# Patient Record
Sex: Female | Born: 1965 | Race: Black or African American | Hispanic: No | Marital: Married | Smoking: Never smoker
Health system: Southern US, Community
[De-identification: ages and names within clinical notes are randomized; demographics above are authoritative.]

## PROBLEM LIST (undated history)

## (undated) DIAGNOSIS — M7542 Impingement syndrome of left shoulder: Secondary | ICD-10-CM

## (undated) DIAGNOSIS — G709 Myoneural disorder, unspecified: Secondary | ICD-10-CM

## (undated) DIAGNOSIS — E669 Obesity, unspecified: Secondary | ICD-10-CM

## (undated) HISTORY — PX: WISDOM TOOTH EXTRACTION: SHX21

## (undated) HISTORY — PX: TUBAL LIGATION: SHX77

---

## 1998-06-17 ENCOUNTER — Encounter: Payer: Self-pay | Admitting: Emergency Medicine

## 1998-06-17 ENCOUNTER — Emergency Department (HOSPITAL_COMMUNITY): Admission: EM | Admit: 1998-06-17 | Discharge: 1998-06-17 | Payer: Self-pay | Admitting: Emergency Medicine

## 2004-04-14 ENCOUNTER — Ambulatory Visit: Payer: Self-pay | Admitting: Family Medicine

## 2004-04-22 ENCOUNTER — Ambulatory Visit: Payer: Self-pay | Admitting: Family Medicine

## 2004-05-04 ENCOUNTER — Emergency Department (HOSPITAL_COMMUNITY): Admission: EM | Admit: 2004-05-04 | Discharge: 2004-05-05 | Payer: Self-pay | Admitting: Emergency Medicine

## 2004-05-05 ENCOUNTER — Ambulatory Visit (HOSPITAL_COMMUNITY): Admission: RE | Admit: 2004-05-05 | Discharge: 2004-05-05 | Payer: Self-pay | Admitting: Family Medicine

## 2004-05-12 ENCOUNTER — Ambulatory Visit: Payer: Self-pay | Admitting: Family Medicine

## 2004-05-13 ENCOUNTER — Ambulatory Visit (HOSPITAL_COMMUNITY): Admission: RE | Admit: 2004-05-13 | Discharge: 2004-05-13 | Payer: Self-pay | Admitting: Family Medicine

## 2004-06-17 ENCOUNTER — Ambulatory Visit (HOSPITAL_COMMUNITY): Admission: RE | Admit: 2004-06-17 | Discharge: 2004-06-17 | Payer: Self-pay | Admitting: Family Medicine

## 2004-08-03 ENCOUNTER — Other Ambulatory Visit: Admission: RE | Admit: 2004-08-03 | Discharge: 2004-08-03 | Payer: Self-pay | Admitting: Otolaryngology

## 2004-08-22 HISTORY — PX: TUMOR REMOVAL: SHX12

## 2004-09-15 ENCOUNTER — Ambulatory Visit (HOSPITAL_COMMUNITY): Admission: RE | Admit: 2004-09-15 | Discharge: 2004-09-16 | Payer: Self-pay | Admitting: Otolaryngology

## 2004-09-19 ENCOUNTER — Ambulatory Visit: Payer: Self-pay | Admitting: *Deleted

## 2012-03-28 ENCOUNTER — Emergency Department (HOSPITAL_COMMUNITY): Payer: No Typology Code available for payment source

## 2012-03-28 ENCOUNTER — Emergency Department (HOSPITAL_COMMUNITY)
Admission: EM | Admit: 2012-03-28 | Discharge: 2012-03-29 | Disposition: A | Discharge status: Home / Self Care | Payer: No Typology Code available for payment source | Attending: Emergency Medicine | Admitting: Emergency Medicine

## 2012-03-28 ENCOUNTER — Encounter (HOSPITAL_COMMUNITY): Payer: Self-pay | Admitting: *Deleted

## 2012-03-28 DIAGNOSIS — Y9301 Activity, walking, marching and hiking: Secondary | ICD-10-CM | POA: Insufficient documentation

## 2012-03-28 DIAGNOSIS — S3981XA Other specified injuries of abdomen, initial encounter: Secondary | ICD-10-CM | POA: Insufficient documentation

## 2012-03-28 DIAGNOSIS — S20219A Contusion of unspecified front wall of thorax, initial encounter: Secondary | ICD-10-CM | POA: Insufficient documentation

## 2012-03-28 DIAGNOSIS — IMO0002 Reserved for concepts with insufficient information to code with codable children: Secondary | ICD-10-CM | POA: Insufficient documentation

## 2012-03-28 DIAGNOSIS — S5000XA Contusion of unspecified elbow, initial encounter: Secondary | ICD-10-CM | POA: Insufficient documentation

## 2012-03-28 LAB — CBC WITH DIFFERENTIAL/PLATELET
Basophils Absolute: 0 10*3/uL (ref 0.0–0.1)
Basophils Relative: 1 % (ref 0–1)
Eosinophils Absolute: 0.7 10*3/uL (ref 0.0–0.7)
MCH: 23.7 pg — ABNORMAL LOW (ref 26.0–34.0)
MCHC: 32.1 g/dL (ref 30.0–36.0)
Monocytes Relative: 6 % (ref 3–12)
Neutro Abs: 3.1 10*3/uL (ref 1.7–7.7)
Neutrophils Relative %: 38 % — ABNORMAL LOW (ref 43–77)
Platelets: 347 10*3/uL (ref 150–400)
RDW: 16.6 % — ABNORMAL HIGH (ref 11.5–15.5)

## 2012-03-28 LAB — COMPREHENSIVE METABOLIC PANEL
AST: 16 U/L (ref 0–37)
Albumin: 3.7 g/dL (ref 3.5–5.2)
Alkaline Phosphatase: 81 U/L (ref 39–117)
BUN: 14 mg/dL (ref 6–23)
Potassium: 3.8 mEq/L (ref 3.5–5.1)
Total Protein: 7.7 g/dL (ref 6.0–8.3)

## 2012-03-28 MED ORDER — ONDANSETRON 8 MG PO TBDP
8.0000 mg | ORAL_TABLET | Freq: Once | ORAL | Status: AC
  Administered 2012-03-28: 8 mg via ORAL
  Filled 2012-03-28: qty 1

## 2012-03-28 MED ORDER — IOHEXOL 300 MG/ML  SOLN
100.0000 mL | Freq: Once | INTRAMUSCULAR | Status: AC | PRN
  Administered 2012-03-28: 100 mL via INTRAVENOUS

## 2012-03-28 MED ORDER — HYDROMORPHONE HCL PF 1 MG/ML IJ SOLN
1.0000 mg | Freq: Once | INTRAMUSCULAR | Status: AC
  Administered 2012-03-28: 1 mg via INTRAVENOUS
  Filled 2012-03-28: qty 1

## 2012-03-28 MED ORDER — SODIUM CHLORIDE 0.9 % IV BOLUS (SEPSIS)
500.0000 mL | Freq: Once | INTRAVENOUS | Status: AC
  Administered 2012-03-28: 500 mL via INTRAVENOUS

## 2012-03-28 MED ORDER — OXYCODONE-ACETAMINOPHEN 5-325 MG PO TABS
2.0000 | ORAL_TABLET | Freq: Once | ORAL | Status: AC
  Administered 2012-03-28: 2 via ORAL
  Filled 2012-03-28: qty 2

## 2012-03-28 NOTE — ED Provider Notes (Signed)
Medical screening examination/treatment/procedure(s) were performed by non-physician practitioner and as supervising physician I was immediately available for consultation/collaboration.  Gerhard Munch, MD 03/28/12 2340

## 2012-03-28 NOTE — ED Notes (Signed)
PA at bedside to assess patient.
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
## 2012-03-28 NOTE — ED Notes (Signed)
IV site stop functioning while pt in CT dept
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
## 2012-03-29 MED ORDER — OXYCODONE-ACETAMINOPHEN 5-325 MG PO TABS
1.0000 | ORAL_TABLET | Freq: Once | ORAL | Status: AC
  Administered 2012-03-29: 1 via ORAL
  Filled 2012-03-29: qty 1

## 2012-03-29 MED ORDER — OXYCODONE-ACETAMINOPHEN 5-325 MG PO TABS: 1.0000 | ORAL_TABLET | Freq: Four times a day (QID) | ORAL | Status: DC | PRN
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
## 2012-06-22 HISTORY — PX: WRIST ARTHRODESIS: SUR65

## 2012-11-08 ENCOUNTER — Encounter (HOSPITAL_BASED_OUTPATIENT_CLINIC_OR_DEPARTMENT_OTHER): Payer: Self-pay | Admitting: *Deleted

## 2012-11-08 NOTE — Progress Notes (Signed)
Pt denies any cardiac or resp problems-had surgery this wrist 3/14 [facility]-no problems

## 2012-11-12 NOTE — H&P (Signed)
Emily Higgins is an 47 y.o. female.   Chief Complaint: right wrist pain  HPI: Pt with injury to right wrist Pt having difficulty with turning of wrist and mobility Pt had MRI showing ecu tendon subluxation and distal radioulnar joint ligament tear Pt here for surgery  Past Medical History  Diagnosis Date  . Neuromuscular disorder     numbness rt hand    Past Surgical History  Procedure Laterality Date  . Tubal ligation    . Wrist arthrodesis  3/14    tendon repair rt wrist  . 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 No pertinent family history. Social History:  reports that she has never smoked. She does not have any smokeless tobacco history on file. She reports that  drinks alcohol. She reports that she does not use illicit drugs.  Allergies: No Known Allergies  No prescriptions prior to admission    No results found for this or any previous visit (from the past 48 hour(s)). No results found.  No recent illnesses or hospitalizations  Height 5\' 1"  (1.549 m), weight 112.038 kg (247 lb), last menstrual period 10/30/2012. General Appearance:  Alert, cooperative, no distress, appears stated age  Head:  Normocephalic, without obvious abnormality, atraumatic  Eyes:  Pupils equal, conjunctiva/corneas clear,         Throat: Lips, mucosa, and tongue normal; teeth and gums normal  Neck: No visible masses     Lungs:   respirations unlabored  Chest Wall:  No tenderness or deformity  Heart:  Regular rate and rhythm,  Abdomen:   Soft, non-tender,         Extremities: Right wrist: limited druj mobility. Fingers warm well perfused Good digital motion and limited wrist mobility. No erythema or lymphangitis Good elbow flexion and extension  Pulses: 2+ and symmetric  Skin: Skin color, texture, turgor normal, no rashes or lesions     Neurologic: Normal    Assessment/Plan Right wrist ecu tendon subluxation and distal radioulnar joint instability and  possible tfcc tear  Right wrist ecu tendon stabilization and open distal radioulnar joint stabilization and or repair  R/B/A DISCUSSED WITH PT IN OFFICE.  PT VOICED UNDERSTANDING OF PLAN CONSENT SIGNED DAY OF SURGERY PT SEEN AND EXAMINED PRIOR TO OPERATIVE PROCEDURE/DAY OF SURGERY SITE MARKED. QUESTIONS ANSWERED WILL SEE ABOUT EXTENT OF SURGERY AND DECIDE ABOUT STAYING OR [facility] FOLLOWING SURGERY  Sharma Covert 11/13/2012 @1255  pm

## 2012-11-13 ENCOUNTER — Ambulatory Visit (HOSPITAL_BASED_OUTPATIENT_CLINIC_OR_DEPARTMENT_OTHER)
Admission: RE | Admit: 2012-11-13 | Discharge: 2012-11-13 | Disposition: A | Discharge status: Home / Self Care | Payer: BC Managed Care – PPO | Source: Ambulatory Visit | Attending: Orthopedic Surgery | Admitting: Orthopedic Surgery

## 2012-11-13 ENCOUNTER — Encounter (HOSPITAL_BASED_OUTPATIENT_CLINIC_OR_DEPARTMENT_OTHER): Payer: Self-pay | Admitting: Anesthesiology

## 2012-11-13 ENCOUNTER — Encounter (HOSPITAL_BASED_OUTPATIENT_CLINIC_OR_DEPARTMENT_OTHER)
Admission: RE | Disposition: A | Discharge status: Home / Self Care | Payer: Self-pay | Source: Ambulatory Visit | Attending: Orthopedic Surgery

## 2012-11-13 ENCOUNTER — Ambulatory Visit (HOSPITAL_BASED_OUTPATIENT_CLINIC_OR_DEPARTMENT_OTHER): Payer: BC Managed Care – PPO | Admitting: Anesthesiology

## 2012-11-13 DIAGNOSIS — X58XXXA Exposure to other specified factors, initial encounter: Secondary | ICD-10-CM | POA: Insufficient documentation

## 2012-11-13 DIAGNOSIS — Z6841 Body Mass Index (BMI) 40.0 and over, adult: Secondary | ICD-10-CM | POA: Insufficient documentation

## 2012-11-13 DIAGNOSIS — G709 Myoneural disorder, unspecified: Secondary | ICD-10-CM | POA: Insufficient documentation

## 2012-11-13 DIAGNOSIS — S63599A Other specified sprain of unspecified wrist, initial encounter: Secondary | ICD-10-CM | POA: Insufficient documentation

## 2012-11-13 DIAGNOSIS — S6991XA Unspecified injury of right wrist, hand and finger(s), initial encounter: Secondary | ICD-10-CM

## 2012-11-13 DIAGNOSIS — Y929 Unspecified place or not applicable: Secondary | ICD-10-CM | POA: Insufficient documentation

## 2012-11-13 DIAGNOSIS — D649 Anemia, unspecified: Secondary | ICD-10-CM | POA: Insufficient documentation

## 2012-11-13 DIAGNOSIS — S66819A Strain of other specified muscles, fascia and tendons at wrist and hand level, unspecified hand, initial encounter: Secondary | ICD-10-CM | POA: Insufficient documentation

## 2012-11-13 HISTORY — PX: TENDON REPAIR: SHX5111

## 2012-11-13 HISTORY — DX: Myoneural disorder, unspecified: G70.9

## 2012-11-13 SURGERY — TENDON REPAIR
Anesthesia: General | Site: Wrist | Laterality: Right | Wound class: Clean

## 2012-11-13 MED ORDER — FENTANYL CITRATE 0.05 MG/ML IJ SOLN
100.0000 ug | Freq: Once | INTRAMUSCULAR | Status: AC
  Administered 2012-11-13: 100 ug via INTRAVENOUS

## 2012-11-13 MED ORDER — FENTANYL CITRATE 0.05 MG/ML IJ SOLN
INTRAMUSCULAR | Status: DC | PRN
  Administered 2012-11-13: 50 ug via INTRAVENOUS
  Administered 2012-11-13: 25 ug via INTRAVENOUS
  Administered 2012-11-13 (×2): 50 ug via INTRAVENOUS
  Administered 2012-11-13: 25 ug via INTRAVENOUS

## 2012-11-13 MED ORDER — OXYCODONE-ACETAMINOPHEN 10-325 MG PO TABS: 1.0000 | ORAL_TABLET | ORAL | Status: DC | PRN

## 2012-11-13 MED ORDER — LACTATED RINGERS IV SOLN
INTRAVENOUS | Status: DC
  Administered 2012-11-13 (×2): via INTRAVENOUS

## 2012-11-13 MED ORDER — ROPIVACAINE HCL 5 MG/ML IJ SOLN
INTRAMUSCULAR | Status: DC | PRN
  Administered 2012-11-13: 30 mL

## 2012-11-13 MED ORDER — OXYCODONE HCL 5 MG PO TABS
5.0000 mg | ORAL_TABLET | Freq: Once | ORAL | Status: AC | PRN
  Administered 2012-11-13: 5 mg via ORAL

## 2012-11-13 MED ORDER — VITAMIN C 500 MG PO TABS: 500.0000 mg | ORAL_TABLET | Freq: Two times a day (BID) | ORAL | Status: DC

## 2012-11-13 MED ORDER — PROMETHAZINE HCL 25 MG/ML IJ SOLN: 6.2500 mg | INTRAMUSCULAR | Status: DC | PRN

## 2012-11-13 MED ORDER — DOCUSATE SODIUM 100 MG PO CAPS: 100.0000 mg | ORAL_CAPSULE | Freq: Two times a day (BID) | ORAL | Status: DC

## 2012-11-13 MED ORDER — HYDROMORPHONE HCL PF 1 MG/ML IJ SOLN
0.2500 mg | INTRAMUSCULAR | Status: DC | PRN
  Administered 2012-11-13 (×4): 0.5 mg via INTRAVENOUS

## 2012-11-13 MED ORDER — OXYCODONE HCL 5 MG/5ML PO SOLN: 5.0000 mg | Freq: Once | ORAL | Status: AC | PRN

## 2012-11-13 MED ORDER — DEXAMETHASONE SODIUM PHOSPHATE 4 MG/ML IJ SOLN
INTRAMUSCULAR | Status: DC | PRN
  Administered 2012-11-13: 10 mg via INTRAVENOUS

## 2012-11-13 MED ORDER — SUCCINYLCHOLINE CHLORIDE 20 MG/ML IJ SOLN
INTRAMUSCULAR | Status: DC | PRN
  Administered 2012-11-13: 100 mg via INTRAVENOUS

## 2012-11-13 MED ORDER — CHLORHEXIDINE GLUCONATE 4 % EX LIQD: 60.0000 mL | Freq: Once | CUTANEOUS | Status: DC

## 2012-11-13 MED ORDER — PROPOFOL 10 MG/ML IV BOLUS
INTRAVENOUS | Status: DC | PRN
  Administered 2012-11-13: 50 mg via INTRAVENOUS
  Administered 2012-11-13: 200 mg via INTRAVENOUS

## 2012-11-13 MED ORDER — MIDAZOLAM HCL 2 MG/2ML IJ SOLN
1.0000 mg | INTRAMUSCULAR | Status: DC | PRN
  Administered 2012-11-13: 2 mg via INTRAVENOUS

## 2012-11-13 MED ORDER — METHOCARBAMOL 500 MG PO TABS: 500.0000 mg | ORAL_TABLET | Freq: Four times a day (QID) | ORAL | Status: DC

## 2012-11-13 MED ORDER — DEXTROSE 5 % IV SOLN
3.0000 g | INTRAVENOUS | Status: AC
  Administered 2012-11-13: 3 g via INTRAVENOUS

## 2012-11-13 MED ORDER — LIDOCAINE HCL (CARDIAC) 20 MG/ML IV SOLN
INTRAVENOUS | Status: DC | PRN
  Administered 2012-11-13: 50 mg via INTRAVENOUS

## 2012-11-13 MED ORDER — ONDANSETRON HCL 4 MG/2ML IJ SOLN
INTRAMUSCULAR | Status: DC | PRN
  Administered 2012-11-13: 4 mg via INTRAVENOUS

## 2012-11-13 MED ORDER — CEFAZOLIN SODIUM-DEXTROSE 2-3 GM-% IV SOLR: 2.0000 g | INTRAVENOUS | Status: DC

## 2012-11-13 SURGICAL SUPPLY — 61 items
APL SKNCLS STERI-STRIP NONHPOA (GAUZE/BANDAGES/DRESSINGS) ×1
BANDAGE ELASTIC 3 VELCRO ST LF (GAUZE/BANDAGES/DRESSINGS) ×2 IMPLANT
BANDAGE ELASTIC 4 VELCRO ST LF (GAUZE/BANDAGES/DRESSINGS) ×2 IMPLANT
BANDAGE GAUZE ELAST BULKY 4 IN (GAUZE/BANDAGES/DRESSINGS) ×2 IMPLANT
BENZOIN TINCTURE PRP APPL 2/3 (GAUZE/BANDAGES/DRESSINGS) ×2 IMPLANT
BLADE SURG 15 STRL LF DISP TIS (BLADE) ×1 IMPLANT
BLADE SURG 15 STRL SS (BLADE) ×2
BNDG CMPR 9X4 STRL LF SNTH (GAUZE/BANDAGES/DRESSINGS) ×1
BNDG ESMARK 4X9 LF (GAUZE/BANDAGES/DRESSINGS) ×2 IMPLANT
CORDS BIPOLAR (ELECTRODE) ×2 IMPLANT
COVER MAYO STAND STRL (DRAPES) ×2 IMPLANT
COVER TABLE BACK 60X90 (DRAPES) ×2 IMPLANT
CUFF TOURNIQUET SINGLE 18IN (TOURNIQUET CUFF) IMPLANT
CUFF TOURNIQUET SINGLE 24IN (TOURNIQUET CUFF) ×2 IMPLANT
DRAPE EXTREMITY T 121X128X90 (DRAPE) ×2 IMPLANT
DRAPE OEC MINIVIEW 54X84 (DRAPES) ×1 IMPLANT
DRAPE SURG 17X23 STRL (DRAPES) ×2 IMPLANT
DRSG EMULSION OIL 3X3 NADH (GAUZE/BANDAGES/DRESSINGS) ×2 IMPLANT
GLOVE BIOGEL PI IND STRL 7.0 (GLOVE) ×1 IMPLANT
GLOVE BIOGEL PI IND STRL 8.5 (GLOVE) ×1 IMPLANT
GLOVE BIOGEL PI INDICATOR 7.0 (GLOVE) ×1
GLOVE BIOGEL PI INDICATOR 8.5 (GLOVE) ×1
GLOVE ECLIPSE 6.5 STRL STRAW (GLOVE) ×2 IMPLANT
GLOVE EPREMIER NITRL EXT CFF L (GLOVE) IMPLANT
GLOVE EXAM NITRILE EXT CFF LRG (GLOVE) ×4 IMPLANT
GLOVE SURG ORTHO 8.0 STRL STRW (GLOVE) ×2 IMPLANT
GOWN BRE IMP PREV XXLGXLNG (GOWN DISPOSABLE) ×2 IMPLANT
GOWN PREVENTION PLUS XLARGE (GOWN DISPOSABLE) ×2 IMPLANT
GOWN PREVENTION PLUS XXLARGE (GOWN DISPOSABLE) IMPLANT
LOOP VESSEL MAXI BLUE (MISCELLANEOUS) IMPLANT
LOOP VESSEL MINI RED (MISCELLANEOUS) IMPLANT
NEEDLE HYPO 25X1 1.5 SAFETY (NEEDLE) IMPLANT
NS IRRIG 1000ML POUR BTL (IV SOLUTION) ×2 IMPLANT
PACK BASIN DAY SURGERY FS (CUSTOM PROCEDURE TRAY) ×2 IMPLANT
PAD CAST 4YDX4 CTTN HI CHSV (CAST SUPPLIES) IMPLANT
PADDING CAST ABS 4INX4YD NS (CAST SUPPLIES) ×2
PADDING CAST ABS COTTON 4X4 ST (CAST SUPPLIES) ×2 IMPLANT
PADDING CAST COTTON 4X4 STRL (CAST SUPPLIES)
SLEEVE SCD COMPRESS KNEE MED (MISCELLANEOUS) ×2 IMPLANT
SLING ARM FOAM STRAP XLG (SOFTGOODS) ×2 IMPLANT
SPLINT FIBERGLASS 3X35 (CAST SUPPLIES) IMPLANT
SPLINT FIBERGLASS 4X30 (CAST SUPPLIES) ×2 IMPLANT
SPONGE GAUZE 4X4 12PLY (GAUZE/BANDAGES/DRESSINGS) ×2 IMPLANT
STOCKINETTE 4X48 STRL (DRAPES) ×2 IMPLANT
STRIP CLOSURE SKIN 1/2X4 (GAUZE/BANDAGES/DRESSINGS) ×2 IMPLANT
SUCTION FRAZIER TIP 10 FR DISP (SUCTIONS) IMPLANT
SUT ETHIBOND 3-0 V-5 (SUTURE) IMPLANT
SUT ETHILON 4 0 PS 2 18 (SUTURE) IMPLANT
SUT MERSILENE 4 0 P 3 (SUTURE) ×4 IMPLANT
SUT MNCRL AB 4-0 PS2 18 (SUTURE) IMPLANT
SUT PROLENE 4 0 PS 2 18 (SUTURE) ×2 IMPLANT
SUT VIC AB 2-0 SH 27 (SUTURE) ×2
SUT VIC AB 2-0 SH 27XBRD (SUTURE) ×1 IMPLANT
SUT VIC AB 3-0 FS2 27 (SUTURE) ×4 IMPLANT
SUT VICRYL 4-0 PS2 18IN ABS (SUTURE) ×2 IMPLANT
SYR BULB 3OZ (MISCELLANEOUS) ×2 IMPLANT
SYR CONTROL 10ML LL (SYRINGE) IMPLANT
TOWEL OR NON WOVEN STRL DISP B (DISPOSABLE) ×2 IMPLANT
TRAY DSU PREP LF (CUSTOM PROCEDURE TRAY) ×2 IMPLANT
TUBE CONNECTING 20X1/4 (TUBING) IMPLANT
UNDERPAD 30X30 INCONTINENT (UNDERPADS AND DIAPERS) ×2 IMPLANT

## 2012-11-13 NOTE — Brief Op Note (Signed)
11/13/2012  12:56 PM  PATIENT:  Emily Higgins  47 y.o. female  PRE-OPERATIVE DIAGNOSIS:  RIGHT WRIST ECU TENDON SUBLUXATION AND TFCC TEAR  POST-OPERATIVE DIAGNOSIS:  SAME  PROCEDURE:  Right wrist ECU tendon stabilization and repair with local tissue  SURGEON:  Surgeon(s) and Role:    * Sharma Covert, MD - Primary  PHYSICIAN ASSISTANT: none  ASSISTANTS: none   ANESTHESIA:   general  EBL:   minimal  BLOOD ADMINISTERED:none  DRAINS: none   LOCAL MEDICATIONS USED:  MARCAINE     SPECIMEN:  No Specimen  DISPOSITION OF SPECIMEN:  N/A  COUNTS:  YES  TOURNIQUET:    DICTATION: .161096  PLAN OF CARE: Admit for overnight observation  PATIENT DISPOSITION:  PACU - hemodynamically stable.   Delay start of Pharmacological VTE agent (>24hrs) due to surgical blood loss or risk of bleeding: not applicable

## 2012-11-13 NOTE — Transfer of Care (Signed)
Immediate Anesthesia Transfer of Care Note  Patient: Emily Higgins  Procedure(s) Performed: Procedure(s): RIGHT WRIST ECU(EXTENSOR CARPI ULNARIS) TENDON STABILIZATION  (Right)  Patient Location: PACU  Anesthesia Type:General and GA combined with regional for post-op pain  Level of Consciousness: awake and alert   Airway & Oxygen Therapy: Patient Spontanous Breathing and Patient connected to face mask oxygen  Post-op Assessment: Report given to PACU RN and Post -op Vital signs reviewed and stable  Post vital signs: Reviewed and stable  Complications: No apparent anesthesia complications

## 2012-11-13 NOTE — Anesthesia Preprocedure Evaluation (Signed)
Anesthesia Evaluation  Patient identified by MRN, date of birth, ID band Patient awake    Reviewed: Allergy & Precautions, H&P   History of Anesthesia Complications Negative for: history of anesthetic complications  Airway Mallampati: I  Neck ROM: Full    Dental  (+) Teeth Intact   Pulmonary neg pulmonary ROS,  breath sounds clear to auscultation        Cardiovascular negative cardio ROS  Rhythm:Regular Rate:Normal     Neuro/Psych    GI/Hepatic Neg liver ROS,   Endo/Other  Morbid obesity  Renal/GU      Musculoskeletal   Abdominal (+) + obese,   Peds  Hematology  (+) Blood dyscrasia, anemia ,   Anesthesia Other Findings   Reproductive/Obstetrics                           Anesthesia Physical Anesthesia Plan  ASA: II  Anesthesia Plan: General   Post-op Pain Management:    Induction: Intravenous  Airway Management Planned: Oral ETT  Additional Equipment:   Intra-op Plan:   Post-operative Plan: Extubation in OR  Informed Consent:   Dental advisory given  Plan Discussed with: CRNA and Surgeon  Anesthesia Plan Comments:         Anesthesia Quick Evaluation

## 2012-11-13 NOTE — Progress Notes (Signed)
Assisted Dr. Massagee with right, ultrasound guided, supraclavicular block. Side rails up, monitors on throughout procedure. See vital signs in flow sheet. Tolerated Procedure well. 

## 2012-11-13 NOTE — Anesthesia Postprocedure Evaluation (Signed)
  Anesthesia Post-op Note  Patient: Emily Higgins  Procedure(s) Performed: Procedure(s): RIGHT WRIST ECU(EXTENSOR CARPI ULNARIS) TENDON STABILIZATION  (Right)  Patient Location: PACU  Anesthesia Type:GA combined with regional for post-op pain  Level of Consciousness: awake  Airway and Oxygen Therapy: Patient Spontanous Breathing  Post-op Pain: moderate  Post-op Assessment: Post-op Vital signs reviewed  Post-op Vital Signs: stable  Complications: No apparent anesthesia complications

## 2012-11-13 NOTE — Anesthesia Procedure Notes (Addendum)
Anesthesia Regional Block:  Supraclavicular block  Pre-Anesthetic Checklist: ,, timeout performed, Correct Patient, Correct Site, Correct Laterality, Correct Procedure, Correct Position, site marked, Risks and benefits discussed,  Surgical consent,  Pre-op evaluation,  At surgeon's request and post-op pain management  Laterality: Left and Upper  Prep: chloraprep       Needles:   Needle Type: Echogenic Needle      Needle Gauge: 22 and 22 G  Needle insertion depth: 5 cm   Additional Needles:  Procedures: Doppler guided and ultrasound guided (picture in chart) Supraclavicular block Narrative:  Start time: 11/13/2012 12:40 PM End time: 11/13/2012 12:51 PM Injection made incrementally with aspirations every 5 mL.  Performed by: Personally  Anesthesiologist: TMassagee  Additional Notes: Tolerated welll   Procedure Name: Intubation Date/Time: 11/13/2012 1:09 PM Performed by: Caren Macadam Pre-anesthesia Checklist: Patient identified, Emergency Drugs available, Suction available and Patient being monitored Patient Re-evaluated:Patient Re-evaluated prior to inductionOxygen Delivery Method: Circle System Utilized Preoxygenation: Pre-oxygenation with 100% oxygen Intubation Type: IV induction Ventilation: Mask ventilation without difficulty Laryngoscope Size: Miller and 2 Grade View: Grade I Tube type: Oral Tube size: 7.0 mm Number of attempts: 1 Airway Equipment and Method: stylet and oral airway Placement Confirmation: ETT inserted through vocal cords under direct vision,  positive ETCO2 and breath sounds checked- equal and bilateral Secured at: 21 cm Tube secured with: Tape Dental Injury: Teeth and Oropharynx as per pre-operative assessment

## 2012-11-14 ENCOUNTER — Encounter (HOSPITAL_BASED_OUTPATIENT_CLINIC_OR_DEPARTMENT_OTHER): Payer: Self-pay | Admitting: Orthopedic Surgery

## 2012-11-14 LAB — POCT HEMOGLOBIN-HEMACUE: Hemoglobin: 10.7 g/dL — ABNORMAL LOW (ref 12.0–15.0)
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
## 2013-01-01 ENCOUNTER — Other Ambulatory Visit (HOSPITAL_COMMUNITY): Payer: Self-pay | Admitting: Orthopedic Surgery

## 2013-01-01 DIAGNOSIS — M25531 Pain in right wrist: Secondary | ICD-10-CM

## 2013-01-02 ENCOUNTER — Ambulatory Visit (HOSPITAL_COMMUNITY)
Admission: RE | Admit: 2013-01-02 | Discharge: 2013-01-02 | Disposition: A | Discharge status: Home / Self Care | Payer: BC Managed Care – PPO | Source: Ambulatory Visit | Attending: Orthopedic Surgery | Admitting: Orthopedic Surgery

## 2013-01-02 DIAGNOSIS — X58XXXA Exposure to other specified factors, initial encounter: Secondary | ICD-10-CM | POA: Insufficient documentation

## 2013-01-02 DIAGNOSIS — S63096A Other dislocation of unspecified wrist and hand, initial encounter: Secondary | ICD-10-CM | POA: Insufficient documentation

## 2013-01-02 DIAGNOSIS — M25531 Pain in right wrist: Secondary | ICD-10-CM

## 2013-01-02 DIAGNOSIS — S63599A Other specified sprain of unspecified wrist, initial encounter: Secondary | ICD-10-CM | POA: Insufficient documentation

## 2013-01-02 DIAGNOSIS — M25539 Pain in unspecified wrist: Secondary | ICD-10-CM | POA: Insufficient documentation

## 2013-03-15 ENCOUNTER — Ambulatory Visit (INDEPENDENT_AMBULATORY_CARE_PROVIDER_SITE_OTHER): Payer: BC Managed Care – PPO | Admitting: Emergency Medicine

## 2013-03-15 VITALS — BP 128/86 | HR 100 | Temp 98.2°F | Resp 20 | Ht 63.0 in | Wt 287.6 lb

## 2013-03-15 DIAGNOSIS — N63 Unspecified lump in unspecified breast: Secondary | ICD-10-CM

## 2013-03-15 NOTE — Patient Instructions (Signed)
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
## 2013-03-17 ENCOUNTER — Telehealth: Payer: Self-pay

## 2013-03-17 NOTE — Telephone Encounter (Signed)
She can try [facility], I have called and they can get her in this Wednesday at 12:45. The order is on your computer please sign so I can fax. Thanks.

## 2013-03-17 NOTE — Telephone Encounter (Signed)
Pt states she saw dr Dareen Piano over the weekend and that they discussed her getting a mammogram today , when she called the breast center they told her the first available was dec 8th and she is wanting to talk with someone about this   Best number is [phone]

## 2013-03-18 NOTE — Telephone Encounter (Signed)
Noted, also with the [place] they need to perform a mammo, this is required also. It will be performed at [facility], as patient did not want to wait for available at breast clinic.

## 2013-03-18 NOTE — Telephone Encounter (Signed)
The request for an urgent test was a breast SONO.

## 2013-03-18 NOTE — Telephone Encounter (Signed)
This is to be done tomorrow, faxed signed order to [facility]. Alycia Rossetti has signed for me.

## 2013-03-19 ENCOUNTER — Encounter (HOSPITAL_COMMUNITY): Payer: Self-pay | Admitting: Pharmacy Technician

## 2013-03-19 ENCOUNTER — Telehealth: Payer: Self-pay | Admitting: Radiology

## 2013-03-19 NOTE — Telephone Encounter (Signed)
[facility] seems to have no record of the patients appt today. I have called there to inquire. Patient phone [phone]

## 2013-03-20 ENCOUNTER — Encounter: Payer: Self-pay | Admitting: Emergency Medicine

## 2013-03-20 ENCOUNTER — Ambulatory Visit (INDEPENDENT_AMBULATORY_CARE_PROVIDER_SITE_OTHER): Payer: BC Managed Care – PPO | Admitting: Emergency Medicine

## 2013-03-20 VITALS — BP 132/80 | HR 98 | Temp 98.0°F | Resp 18 | Ht 63.5 in | Wt 293.0 lb

## 2013-03-20 DIAGNOSIS — N611 Abscess of the breast and nipple: Secondary | ICD-10-CM

## 2013-03-20 DIAGNOSIS — N6081 Other benign mammary dysplasias of right breast: Secondary | ICD-10-CM

## 2013-03-20 DIAGNOSIS — N6089 Other benign mammary dysplasias of unspecified breast: Secondary | ICD-10-CM

## 2013-03-20 DIAGNOSIS — N61 Mastitis without abscess: Secondary | ICD-10-CM

## 2013-03-20 MED ORDER — SULFAMETHOXAZOLE-TRIMETHOPRIM 800-160 MG PO TABS: 1.0000 | ORAL_TABLET | Freq: Two times a day (BID) | ORAL | Status: DC

## 2013-03-20 NOTE — Patient Instructions (Signed)
Abscess An abscess is an infected area that contains a collection of pus and debris.It can occur in almost any part of the body. An abscess is also known as a furuncle or boil. CAUSES  An abscess occurs when tissue gets infected. This can occur from blockage of oil or sweat glands, infection of hair follicles, or a minor injury to the skin. As the body tries to fight the infection, pus collects in the area and creates pressure under the skin. This pressure causes pain. People with weakened immune systems have difficulty fighting infections and get certain abscesses more often.  SYMPTOMS Usually an abscess develops on the skin and becomes a painful mass that is red, warm, and tender. If the abscess forms under the skin, you may feel a moveable soft area under the skin. Some abscesses break open (rupture) on their own, but most will continue to get worse without care. The infection can spread deeper into the body and eventually into the bloodstream, causing you to feel ill.  DIAGNOSIS  Your caregiver will take your medical history and perform a physical exam. A sample of fluid may also be taken from the abscess to determine what is causing your infection. TREATMENT  Your caregiver may prescribe antibiotic medicines to fight the infection. However, taking antibiotics alone usually does not cure an abscess. Your caregiver may need to make a small cut (incision) in the abscess to drain the pus. In some cases, gauze is packed into the abscess to reduce pain and to continue draining the area. HOME CARE INSTRUCTIONS   Only take over-the-counter or prescription medicines for pain, discomfort, or fever as directed by your caregiver.  If you were prescribed antibiotics, take them as directed. Finish them even if you start to feel better.  If gauze is used, follow your caregiver's directions for changing the gauze.  To avoid spreading the infection:  Keep your draining abscess covered with a  bandage.  Wash your hands well.  Do not share personal care items, towels, or whirlpools with others.  Avoid skin contact with others.  Keep your skin and clothes clean around the abscess.  Keep all follow-up appointments as directed by your caregiver. SEEK MEDICAL CARE IF:   You have increased pain, swelling, redness, fluid drainage, or bleeding.  You have muscle aches, chills, or a general ill feeling.  You have a fever. MAKE SURE YOU:   Understand these instructions.  Will watch your condition.  Will get help right away if you are not doing well or get worse. Document Released: 01/18/2005 Document Revised: 10/10/2011 Document Reviewed: 06/23/2011 Exit[employer] Patient Information 2014 [employer].  

## 2013-03-20 NOTE — Progress Notes (Signed)
[facility] [address] [place] [address] [phone]  Date:  03/20/2013   Name:  Emily Higgins   DOB:  [birth year]   MRN:  [id number]  PCP:  No PCP Per Patient    Chief Complaint: Follow-up   History of Present Illness:  Emily Higgins is a 47 y.o. very pleasant female patient who presents with the following:  Seen for a breast mass.  Had a negative sono and mamogram.  Now has small drainage from medial region of abscess.  Interval history negative.  No improvement with over the counter medications or other home remedies. Denies other complaint or health concern today.   There are no active problems to display for this patient.   Past Medical History  Diagnosis Date  . Neuromuscular disorder     numbness rt hand    Past Surgical History  Procedure Laterality Date  . Tubal ligation    . Wrist arthrodesis  3/14    tendon repair rt wrist  . 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 Tendon repair Right 11/13/2012    Procedure: RIGHT WRIST ECU(EXTENSOR CARPI ULNARIS) TENDON STABILIZATION ;  Surgeon: Sharma Covert, MD;  Location: [facility];  Service: Orthopedics;  Laterality: Right;    History  Substance Use Topics  . Smoking status: Never Smoker   . Smokeless tobacco: Not on file  . Alcohol Use: Yes     Comment: occ    Family History  Problem Relation Age of Onset  . Diabetes Mother   . Cancer Mother     Breast  . Hypertension Mother     No Known Allergies  Medication list has been reviewed and updated.  Current Outpatient Prescriptions on File Prior to Visit  Medication Sig Dispense Refill  . Cyanocobalamin (VITAMIN B-12 PO) Take 1 tablet by mouth daily.      . Omega-3 Fatty Acids (OMEGA 3 PO) Take 1 capsule by mouth daily.      Marland Kitchen oxyCODONE-acetaminophen (PERCOCET) 10-325 MG per tablet Take 1 tablet by mouth every 4 (four) hours as needed for pain.  40 tablet  0   No current facility-administered  medications on file prior to visit.    Review of Systems:  As per HPI, otherwise negative.    Physical Examination: Filed Vitals:   03/20/13 1303  BP: 132/80  Pulse: 98  Temp: 98 F (36.7 C)  Resp: 18   Filed Vitals:   03/20/13 1303  Height: 5' 3.5" (1.613 m)  Weight: 293 lb (132.904 kg)   Body mass index is 51.08 kg/(m^2). Ideal Body Weight: Weight in (lb) to have BMI = 25: 143.1   GEN: WDWN, NAD, Non-toxic, Alert & Oriented x 3 HEENT: Atraumatic, Normocephalic.  Ears and Nose: No external deformity. EXTR: No clubbing/cyanosis/edema NEURO: Normal gait.  PSYCH: Normally interactive. Conversant. Not depressed or anxious appearing.  Calm demeanor.  RIGHT breast:  Abscess with significant purulent drainage.  No surrounding cellulitis   Assessment and Plan: Abscess breast.  Not lactating [facility] RTC for wound care.   Signed,  Phillips Odor, MD

## 2013-03-20 NOTE — Progress Notes (Signed)
Procedure:  Consent obtained.  Local anesthesia with 2% lido with epi.  #11 blade used to make a 2 cm incision through the area that was draining.  Copious amount of pus expressed along with sebaceous material.  Packed with 1/4in plain packing and drsg applied.  Pt to use warm compresses and recheck in 2 days.

## 2013-03-21 NOTE — Telephone Encounter (Signed)
They did recall the scheduled appt, and have taken care of her.

## 2013-03-22 ENCOUNTER — Ambulatory Visit (INDEPENDENT_AMBULATORY_CARE_PROVIDER_SITE_OTHER): Payer: BC Managed Care – PPO | Admitting: Physician Assistant

## 2013-03-22 VITALS — BP 130/82 | HR 84 | Temp 98.6°F | Resp 18

## 2013-03-22 DIAGNOSIS — N6089 Other benign mammary dysplasias of unspecified breast: Secondary | ICD-10-CM

## 2013-03-22 DIAGNOSIS — N611 Abscess of the breast and nipple: Secondary | ICD-10-CM

## 2013-03-22 DIAGNOSIS — N6081 Other benign mammary dysplasias of right breast: Secondary | ICD-10-CM

## 2013-03-22 DIAGNOSIS — N61 Mastitis without abscess: Secondary | ICD-10-CM

## 2013-03-22 NOTE — Progress Notes (Signed)
   Subjective:    Patient ID: Emily Higgins, female    DOB: Dec 07, 1965, 47 y.o.   MRN: [id number]  HPI  Pt presents to clinic for wound recheck.  She is having local pain and continues to have drainage from the area.  She is trying to keep it covered but it is hard due to the sweating of the area.  She is tolerating the abx ok.  Review of Systems     Objective:   Physical Exam  Vitals reviewed. Constitutional: She is oriented to person, place, and time. She appears well-developed and well-nourished.  Pulmonary/Chest: Effort normal.  Neurological: She is alert and oriented to person, place, and time.  Skin: Skin is warm and dry.  Drsg and packing removed.  Small amount of purulent drainage from the area - significantly reduced erythema and no induration around the incision.  Wound was irrigated and repacked with 1/4 in plain packing.  Drsg placed.  Wound care d/w pt.  Psychiatric: She has a normal mood and affect. Her behavior is normal. Judgment and thought content normal.       Assessment & Plan:  Sebaceous cyst of breast, right  Abscess of breast  Continued abx.  Daily drsg changes.  Recheck in 48h for wound recheck and repacking.  Benny Lennert PA-C 03/22/2013 4:43 PM

## 2013-03-23 LAB — WOUND CULTURE

## 2013-03-25 ENCOUNTER — Ambulatory Visit (INDEPENDENT_AMBULATORY_CARE_PROVIDER_SITE_OTHER): Payer: BC Managed Care – PPO | Admitting: Physician Assistant

## 2013-03-25 VITALS — BP 128/74 | HR 94 | Temp 98.2°F | Resp 16

## 2013-03-25 DIAGNOSIS — N61 Mastitis without abscess: Secondary | ICD-10-CM

## 2013-03-25 DIAGNOSIS — N611 Abscess of the breast and nipple: Secondary | ICD-10-CM

## 2013-03-25 DIAGNOSIS — N6089 Other benign mammary dysplasias of unspecified breast: Secondary | ICD-10-CM

## 2013-03-25 DIAGNOSIS — N6081 Other benign mammary dysplasias of right breast: Secondary | ICD-10-CM

## 2013-03-25 NOTE — Progress Notes (Signed)
   Patient ID: Emily Higgins MRN: [id number], DOB: 10/01/1965 47 y.o. Date of Encounter: 03/25/2013, 1:50 PM  Primary Physician: No PCP Per Patient  Chief Complaint: Wound care   See previous note  HPI: 47 y.o. female presents for wound care s/p I&D on 03/20/13 Doing well No issues or complaints Afebrile/ no chills No nausea or vomiting Tolerating Bactrim DS Pain improving Daily dressing change Previous notes reviewed  She does have a right wrist orthopedic procedure scheduled for 03/29/13 that will require a 1 night hospital stay. See plan for detailed wound care instructions.   Past Medical History  Diagnosis Date  . Neuromuscular disorder     numbness rt hand     Home Meds: Prior to Admission medications   Medication Sig Start Date End Date Taking? Authorizing Provider  Cyanocobalamin (VITAMIN B-12 PO) Take 1 tablet by mouth daily.   Yes Historical Provider, MD  Omega-3 Fatty Acids (OMEGA 3 PO) Take 1 capsule by mouth daily.   Yes Historical Provider, MD  oxyCODONE-acetaminophen (PERCOCET) 10-325 MG per tablet Take 1 tablet by mouth every 4 (four) hours as needed for pain. 11/13/12  Yes Sharma Covert, MD  sulfamethoxazole-trimethoprim (BACTRIM DS,SEPTRA DS) 800-160 MG per tablet Take 1 tablet by mouth 2 (two) times daily. 03/20/13  Yes Phillips Odor, MD    Allergies: No Known Allergies  ROS: Constitutional: Afebrile, no chills Cardiovascular: negative for chest pain or palpitations Dermatological: Positive for wound and improving pain. Negative for erythema or warmth  GI: No nausea or vomiting   EXAM: Physical Exam: Blood pressure 128/74, pulse 94, temperature 98.2 F (36.8 C), temperature source Oral, resp. rate 16, last menstrual period 03/10/2013, SpO2 96.00%., There is no weight on file to calculate BMI. General: Well developed, well nourished, in no acute distress. Nontoxic appearing. Head: Normocephalic, atraumatic, sclera non-icteric.  Neck:  Supple. Lungs: Breathing is unlabored. Heart: Normal rate. Skin:  Warm and moist. Dressing and packing in place. Mild local induration, erythema, and tenderness to palpation around the incision site. Neuro: Alert and oriented X 3. Moves all extremities spontaneously. Normal gait.  Psych:  Responds to questions appropriately with a normal affect.   PROCEDURE: Dressing and packing removed Scant purulent drainage on the dressing Scant purulence initially expressed Wound bed healthy Irrigated with 1% plain lidocaine 5 cc Repacked with small amount of 1/4 inch plain packing  Dressing applied  Amy Cecil in room for the entire procedure   LAB: Culture: GBS  A/P: 47 y.o. female with cellulitis/abscess of the medial right breast as above s/p I&D on 03/20/13 -Wound care per above -Continue Bactrim DS -Pain well controlled and improving -Daily dressing changes -Patient will be admitted for planned orthopedic procedure on 03/29/13 with a 1 night hospital stay. She will follow up with [place] for wound care upon discharge on 03/30/13. She will ask the nursing staff while she is admitted if they are able to perform her wound care, if this is able to happen, she will then follow up with [place] 48 hours after that occurs. She understands these instructions.   Signed, Eula Listen, PA-C [facility] [place], [place] [address] [phone] 03/25/2013 1:50 PM

## 2013-03-26 ENCOUNTER — Encounter (HOSPITAL_COMMUNITY): Payer: Self-pay

## 2013-03-26 ENCOUNTER — Encounter (HOSPITAL_COMMUNITY)
Admission: RE | Admit: 2013-03-26 | Discharge: 2013-03-26 | Disposition: A | Discharge status: Home / Self Care | Payer: BC Managed Care – PPO | Source: Ambulatory Visit | Attending: Orthopedic Surgery | Admitting: Orthopedic Surgery

## 2013-03-26 LAB — CBC
Hemoglobin: 10.8 g/dL — ABNORMAL LOW (ref 12.0–15.0)
MCHC: 31.4 g/dL (ref 30.0–36.0)
WBC: 6.1 10*3/uL (ref 4.0–10.5)

## 2013-03-26 LAB — HCG, SERUM, QUALITATIVE: Preg, Serum: NEGATIVE

## 2013-03-26 NOTE — H&P (Signed)
Emily Higgins is an 47 y.o. female.   Chief Complaint: Right wrist pain HPI: PT WELL KNOWN TO ME IN OFFICE PT WITH PERSISTENT RIGHT WRIST PAIN AND INABILITY TO ROTATE HER FOREARM PT HERE FOR SURGERY TO STABILIZE HER WRIST PT SEEN/EXAMINED AND FOLLOWED IN OFFICE  Past Medical History  Diagnosis Date  . Neuromuscular disorder     numbness rt hand    Past Surgical History  Procedure Laterality Date  . Tubal ligation    . Wrist arthrodesis  3/14    tendon repair rt wrist  . 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 Tendon repair Right 11/13/2012    Procedure: RIGHT WRIST ECU(EXTENSOR CARPI ULNARIS) TENDON STABILIZATION ;  Surgeon: Sharma Covert, MD;  Location: [facility];  Service: Orthopedics;  Laterality: Right;    Family History  Problem Relation Age of Onset  . Diabetes Mother   . Cancer Mother     Breast  . Hypertension Mother    Social History:  reports that she has never smoked. She does not have any smokeless tobacco history on file. She reports that she drinks alcohol. She reports that she does not use illicit drugs.  Allergies: No Known Allergies  No prescriptions prior to admission    Results for orders placed during the hospital encounter of 03/26/13 (from the past 48 hour(s))  CBC     Status: Abnormal   Collection Time    03/26/13  3:01 PM      Result Value Range   WBC 6.1  4.0 - 10.5 K/uL   RBC 4.56  3.87 - 5.11 MIL/uL   Hemoglobin 10.8 (*) 12.0 - 15.0 g/dL   HCT 16.1 (*) 09.6 - 04.5 %   MCV 75.4 (*) 78.0 - 100.0 fL   MCH 23.7 (*) 26.0 - 34.0 pg   MCHC 31.4  30.0 - 36.0 g/dL   RDW 40.9 (*) 81.1 - 91.4 %   Platelets 288  150 - 400 K/uL  HCG, SERUM, QUALITATIVE     Status: None   Collection Time    03/26/13  3:01 PM      Result Value Range   Preg, Serum NEGATIVE  NEGATIVE   Comment:            THE SENSITIVITY OF THIS     METHODOLOGY IS >10 mIU/mL.   No results found.  ROS PT ON ANTIBIOTICS FOR LOW GRADE INFECTION IN BREAST  REGION NOTES REVIEWED  Last menstrual period 03/10/2013. Physical Exam  General Appearance:  Alert, cooperative, no distress, appears stated age  Head:  Normocephalic, without obvious abnormality, atraumatic  Eyes:  Pupils equal, conjunctiva/corneas clear,         Throat: Lips, mucosa, and tongue normal; teeth and gums normal  Neck: No visible masses     Lungs:   respirations unlabored  Chest Wall:  No tenderness or deformity  Heart:  Regular rate and rhythm,  Abdomen:   Soft, non-tender,         Extremities: RIGHT WRIST WELL HEALED INCISION DORSALLY, UNABLE TO PRONATE AND SUPINATE FOREARM FINGERS WARM WELL PERFUSED LIMITED DIGITAL MOTION GOOD RADIAL PULSE GOOD ELBOW FLEXION AND EXTENSION  Pulses: 2+ and symmetric  Skin: Skin color, texture, turgor normal, no rashes or lesions     Neurologic: Normal    Assessment/Plan RIGHT WRIST DISTAL RADIOULNAR JOINT INSTABILITY AND DRUJ INCONGRUITY  RIGHT WRIST DISTAL RADIOULNAR JOINT EXPLORATION AND REPAIR VERSUS TENDON GRAFTING, ADAMS PROCEDURE FOR  STABILIZATION OF DISTAL RADIOULNAR JOINT  R/B/A DISCUSSED WITH PT IN OFFICE.  PT VOICED UNDERSTANDING OF PLAN CONSENT SIGNED DAY OF SURGERY PT SEEN AND EXAMINED PRIOR TO OPERATIVE PROCEDURE/DAY OF SURGERY SITE MARKED. QUESTIONS ANSWERED WILL REMAIN OVERNIGHT OBSERVATION FOLLOWING SURGERY  Sharma Covert 03/26/2013, 6:47 PM

## 2013-03-27 ENCOUNTER — Ambulatory Visit (HOSPITAL_COMMUNITY)
Admission: RE | Admit: 2013-03-27 | Discharge: 2013-03-29 | Disposition: A | Discharge status: Home / Self Care | Payer: BC Managed Care – PPO | Source: Ambulatory Visit | Attending: Orthopedic Surgery | Admitting: Orthopedic Surgery

## 2013-03-27 ENCOUNTER — Encounter (HOSPITAL_COMMUNITY): Payer: BC Managed Care – PPO | Admitting: Anesthesiology

## 2013-03-27 ENCOUNTER — Encounter (HOSPITAL_COMMUNITY)
Admission: RE | Disposition: A | Discharge status: Home / Self Care | Payer: Self-pay | Source: Ambulatory Visit | Attending: Orthopedic Surgery

## 2013-03-27 ENCOUNTER — Encounter (HOSPITAL_COMMUNITY): Payer: Self-pay | Admitting: *Deleted

## 2013-03-27 ENCOUNTER — Ambulatory Visit (HOSPITAL_COMMUNITY): Payer: BC Managed Care – PPO | Admitting: Anesthesiology

## 2013-03-27 DIAGNOSIS — M25531 Pain in right wrist: Secondary | ICD-10-CM | POA: Diagnosis present

## 2013-03-27 DIAGNOSIS — Z01812 Encounter for preprocedural laboratory examination: Secondary | ICD-10-CM | POA: Insufficient documentation

## 2013-03-27 DIAGNOSIS — IMO0002 Reserved for concepts with insufficient information to code with codable children: Secondary | ICD-10-CM | POA: Insufficient documentation

## 2013-03-27 DIAGNOSIS — M24439 Recurrent dislocation, unspecified wrist: Secondary | ICD-10-CM | POA: Insufficient documentation

## 2013-03-27 HISTORY — PX: WRIST FUSION: SHX839

## 2013-03-27 SURGERY — FUSION, JOINT, WRIST
Anesthesia: General | Site: Wrist | Laterality: Right

## 2013-03-27 MED ORDER — CEFAZOLIN SODIUM 1-5 GM-% IV SOLN
1.0000 g | INTRAVENOUS | Status: AC
  Administered 2013-03-27: 1 g via INTRAVENOUS
  Filled 2013-03-27: qty 50

## 2013-03-27 MED ORDER — HYDROMORPHONE HCL 2 MG PO TABS: 2.0000 mg | ORAL_TABLET | ORAL | Status: DC | PRN

## 2013-03-27 MED ORDER — VECURONIUM BROMIDE 10 MG IV SOLR
INTRAVENOUS | Status: DC | PRN
  Administered 2013-03-27 (×3): 1 mg via INTRAVENOUS
  Administered 2013-03-27: 2 mg via INTRAVENOUS

## 2013-03-27 MED ORDER — MINERAL OIL LIGHT 100 % EX OIL
TOPICAL_OIL | CUTANEOUS | Status: DC | PRN
  Administered 2013-03-27: 1 via TOPICAL

## 2013-03-27 MED ORDER — HYDROMORPHONE HCL PF 1 MG/ML IJ SOLN
0.5000 mg | INTRAMUSCULAR | Status: DC | PRN
  Administered 2013-03-27 – 2013-03-28 (×3): 1 mg via INTRAVENOUS
  Administered 2013-03-28: 0.5 mg via INTRAVENOUS
  Administered 2013-03-28 (×2): 1 mg via INTRAVENOUS
  Filled 2013-03-27 (×6): qty 1

## 2013-03-27 MED ORDER — HYDROMORPHONE HCL PF 1 MG/ML IJ SOLN
0.2500 mg | INTRAMUSCULAR | Status: DC | PRN
  Administered 2013-03-27 (×5): 0.5 mg via INTRAVENOUS

## 2013-03-27 MED ORDER — LIDOCAINE HCL (CARDIAC) 20 MG/ML IV SOLN
INTRAVENOUS | Status: DC | PRN
  Administered 2013-03-27: 100 mg via INTRAVENOUS

## 2013-03-27 MED ORDER — ONDANSETRON HCL 4 MG PO TABS: 4.0000 mg | ORAL_TABLET | Freq: Four times a day (QID) | ORAL | Status: DC | PRN

## 2013-03-27 MED ORDER — ONDANSETRON HCL 4 MG/2ML IJ SOLN: 4.0000 mg | Freq: Four times a day (QID) | INTRAMUSCULAR | Status: DC | PRN

## 2013-03-27 MED ORDER — SUCCINYLCHOLINE CHLORIDE 20 MG/ML IJ SOLN
INTRAMUSCULAR | Status: DC | PRN
  Administered 2013-03-27: 100 mg via INTRAVENOUS

## 2013-03-27 MED ORDER — OXYCODONE-ACETAMINOPHEN 10-325 MG PO TABS: 1.0000 | ORAL_TABLET | ORAL | Status: DC | PRN

## 2013-03-27 MED ORDER — VITAMIN C 500 MG PO TABS
1000.0000 mg | ORAL_TABLET | Freq: Every day | ORAL | Status: DC
  Administered 2013-03-27 – 2013-03-29 (×3): 1000 mg via ORAL
  Filled 2013-03-27 (×3): qty 2

## 2013-03-27 MED ORDER — SULFAMETHOXAZOLE-TMP DS 800-160 MG PO TABS
1.0000 | ORAL_TABLET | Freq: Two times a day (BID) | ORAL | Status: DC
  Administered 2013-03-27 – 2013-03-29 (×4): 1 via ORAL
  Filled 2013-03-27 (×5): qty 1

## 2013-03-27 MED ORDER — ONDANSETRON HCL 4 MG/2ML IJ SOLN
INTRAMUSCULAR | Status: DC | PRN
  Administered 2013-03-27: 4 mg via INTRAVENOUS

## 2013-03-27 MED ORDER — DOCUSATE SODIUM 100 MG PO CAPS
100.0000 mg | ORAL_CAPSULE | Freq: Two times a day (BID) | ORAL | Status: DC
  Administered 2013-03-27 – 2013-03-29 (×4): 100 mg via ORAL
  Filled 2013-03-27 (×4): qty 1

## 2013-03-27 MED ORDER — HYDROMORPHONE HCL PF 1 MG/ML IJ SOLN
INTRAMUSCULAR | Status: AC
  Filled 2013-03-27: qty 1

## 2013-03-27 MED ORDER — HYDROMORPHONE HCL PF 1 MG/ML IJ SOLN
0.5000 mg | INTRAMUSCULAR | Status: DC | PRN
  Administered 2013-03-27 (×2): 0.5 mg via INTRAVENOUS

## 2013-03-27 MED ORDER — SULFAMETHOXAZOLE-TRIMETHOPRIM 800-160 MG PO TABS: 1.0000 | ORAL_TABLET | Freq: Two times a day (BID) | ORAL | Status: DC

## 2013-03-27 MED ORDER — ROCURONIUM BROMIDE 100 MG/10ML IV SOLN
INTRAVENOUS | Status: DC | PRN
  Administered 2013-03-27: 40 mg via INTRAVENOUS
  Administered 2013-03-27: 10 mg via INTRAVENOUS

## 2013-03-27 MED ORDER — 0.9 % SODIUM CHLORIDE (POUR BTL) OPTIME
TOPICAL | Status: DC | PRN
  Administered 2013-03-27: 1000 mL

## 2013-03-27 MED ORDER — CEFAZOLIN SODIUM 1-5 GM-% IV SOLN
1.0000 g | Freq: Three times a day (TID) | INTRAVENOUS | Status: DC
  Administered 2013-03-27 – 2013-03-28 (×3): 1 g via INTRAVENOUS
  Filled 2013-03-27 (×4): qty 50

## 2013-03-27 MED ORDER — PHENYLEPHRINE HCL 10 MG/ML IJ SOLN
INTRAMUSCULAR | Status: DC | PRN
  Administered 2013-03-27: 80 ug via INTRAVENOUS

## 2013-03-27 MED ORDER — MIDAZOLAM HCL 5 MG/5ML IJ SOLN
INTRAMUSCULAR | Status: DC | PRN
  Administered 2013-03-27: 2 mg via INTRAVENOUS

## 2013-03-27 MED ORDER — DIPHENHYDRAMINE HCL 25 MG PO CAPS: 25.0000 mg | ORAL_CAPSULE | Freq: Four times a day (QID) | ORAL | Status: DC | PRN

## 2013-03-27 MED ORDER — FENTANYL CITRATE 0.05 MG/ML IJ SOLN
INTRAMUSCULAR | Status: DC | PRN
  Administered 2013-03-27 (×4): 50 ug via INTRAVENOUS
  Administered 2013-03-27 (×2): 25 ug via INTRAVENOUS
  Administered 2013-03-27: 50 ug via INTRAVENOUS
  Administered 2013-03-27: 25 ug via INTRAVENOUS
  Administered 2013-03-27: 100 ug via INTRAVENOUS
  Administered 2013-03-27 (×2): 25 ug via INTRAVENOUS

## 2013-03-27 MED ORDER — METHOCARBAMOL 500 MG PO TABS
500.0000 mg | ORAL_TABLET | Freq: Four times a day (QID) | ORAL | Status: DC | PRN
  Administered 2013-03-27 – 2013-03-29 (×6): 500 mg via ORAL
  Filled 2013-03-27 (×7): qty 1

## 2013-03-27 MED ORDER — PROPOFOL 10 MG/ML IV BOLUS
INTRAVENOUS | Status: DC | PRN
  Administered 2013-03-27: 340 mg via INTRAVENOUS

## 2013-03-27 MED ORDER — GLYCOPYRROLATE 0.2 MG/ML IJ SOLN
INTRAMUSCULAR | Status: DC | PRN
  Administered 2013-03-27: 0.4 mg via INTRAVENOUS

## 2013-03-27 MED ORDER — HYDROCODONE-ACETAMINOPHEN 5-325 MG PO TABS
ORAL_TABLET | ORAL | Status: AC
  Administered 2013-03-27: 2
  Filled 2013-03-27: qty 2

## 2013-03-27 MED ORDER — DOCUSATE SODIUM 100 MG PO CAPS: 100.0000 mg | ORAL_CAPSULE | Freq: Two times a day (BID) | ORAL | Status: DC

## 2013-03-27 MED ORDER — CEFAZOLIN SODIUM 1-5 GM-% IV SOLN
1.0000 g | Freq: Once | INTRAVENOUS | Status: DC
  Filled 2013-03-27: qty 50

## 2013-03-27 MED ORDER — LACTATED RINGERS IV SOLN
INTRAVENOUS | Status: DC | PRN
  Administered 2013-03-27 (×3): via INTRAVENOUS

## 2013-03-27 MED ORDER — DEXTROSE 5 % IV SOLN
500.0000 mg | Freq: Four times a day (QID) | INTRAVENOUS | Status: DC | PRN
  Filled 2013-03-27 (×2): qty 5

## 2013-03-27 MED ORDER — ADULT MULTIVITAMIN W/MINERALS CH
1.0000 | ORAL_TABLET | Freq: Every day | ORAL | Status: DC
  Administered 2013-03-27 – 2013-03-29 (×3): 1 via ORAL
  Filled 2013-03-27 (×3): qty 1

## 2013-03-27 MED ORDER — HYDROCODONE-ACETAMINOPHEN 7.5-325 MG PO TABS
1.0000 | ORAL_TABLET | ORAL | Status: DC | PRN
  Administered 2013-03-28 (×2): 2 via ORAL
  Filled 2013-03-27 (×2): qty 2

## 2013-03-27 MED ORDER — OXYCODONE HCL 5 MG/5ML PO SOLN: 5.0000 mg | Freq: Once | ORAL | Status: DC | PRN

## 2013-03-27 MED ORDER — NEOSTIGMINE METHYLSULFATE 1 MG/ML IJ SOLN
INTRAMUSCULAR | Status: DC | PRN
  Administered 2013-03-27: 3 mg via INTRAVENOUS

## 2013-03-27 MED ORDER — MINERAL OIL LIGHT 100 % EX OIL
TOPICAL_OIL | CUTANEOUS | Status: AC
  Filled 2013-03-27: qty 25

## 2013-03-27 MED ORDER — OXYCODONE HCL 5 MG PO TABS: 5.0000 mg | ORAL_TABLET | Freq: Once | ORAL | Status: DC | PRN

## 2013-03-27 MED ORDER — CEFAZOLIN SODIUM-DEXTROSE 2-3 GM-% IV SOLR
INTRAVENOUS | Status: AC
  Administered 2013-03-27: 3 g via INTRAVENOUS
  Filled 2013-03-27: qty 50

## 2013-03-27 MED ORDER — EPHEDRINE SULFATE 50 MG/ML IJ SOLN
INTRAMUSCULAR | Status: DC | PRN
  Administered 2013-03-27: 5 mg via INTRAVENOUS

## 2013-03-27 MED ORDER — HYDROMORPHONE HCL PF 1 MG/ML IJ SOLN
INTRAMUSCULAR | Status: AC
  Administered 2013-03-27: 0.5 mg via INTRAVENOUS
  Filled 2013-03-27: qty 1

## 2013-03-27 MED ORDER — KCL IN DEXTROSE-NACL 20-5-0.45 MEQ/L-%-% IV SOLN
INTRAVENOUS | Status: DC
  Administered 2013-03-27 – 2013-03-28 (×2): via INTRAVENOUS
  Filled 2013-03-27 (×3): qty 1000

## 2013-03-27 MED ORDER — MIDAZOLAM HCL 2 MG/2ML IJ SOLN: 0.5000 mg | Freq: Once | INTRAMUSCULAR | Status: DC | PRN

## 2013-03-27 MED ORDER — OXYCODONE-ACETAMINOPHEN 5-325 MG PO TABS
1.0000 | ORAL_TABLET | ORAL | Status: DC | PRN
  Administered 2013-03-27 – 2013-03-29 (×5): 1 via ORAL
  Filled 2013-03-27 (×5): qty 1

## 2013-03-27 MED ORDER — PROMETHAZINE HCL 25 MG/ML IJ SOLN: 6.2500 mg | INTRAMUSCULAR | Status: DC | PRN

## 2013-03-27 MED ORDER — ALPRAZOLAM 0.5 MG PO TABS: 0.5000 mg | ORAL_TABLET | Freq: Four times a day (QID) | ORAL | Status: DC | PRN

## 2013-03-27 MED ORDER — KCL IN DEXTROSE-NACL 20-5-0.45 MEQ/L-%-% IV SOLN
INTRAVENOUS | Status: AC
  Filled 2013-03-27: qty 1000

## 2013-03-27 MED ORDER — ZOLPIDEM TARTRATE 5 MG PO TABS: 5.0000 mg | ORAL_TABLET | Freq: Every evening | ORAL | Status: DC | PRN

## 2013-03-27 MED ORDER — LACTATED RINGERS IV SOLN
INTRAVENOUS | Status: DC
  Administered 2013-03-27: 13:00:00 via INTRAVENOUS

## 2013-03-27 MED ORDER — METHOCARBAMOL 500 MG PO TABS: 500.0000 mg | ORAL_TABLET | Freq: Four times a day (QID) | ORAL | Status: DC | PRN

## 2013-03-27 MED ORDER — CHLORHEXIDINE GLUCONATE 4 % EX LIQD: 60.0000 mL | Freq: Once | CUTANEOUS | Status: DC

## 2013-03-27 MED ORDER — MEPERIDINE HCL 25 MG/ML IJ SOLN: 6.2500 mg | INTRAMUSCULAR | Status: DC | PRN

## 2013-03-27 MED ORDER — OXYCODONE HCL 5 MG PO TABS
5.0000 mg | ORAL_TABLET | ORAL | Status: DC | PRN
  Administered 2013-03-28 (×4): 10 mg via ORAL
  Administered 2013-03-28 (×2): 5 mg via ORAL
  Administered 2013-03-28 – 2013-03-29 (×5): 10 mg via ORAL
  Filled 2013-03-27: qty 2
  Filled 2013-03-27: qty 1
  Filled 2013-03-27 (×4): qty 2
  Filled 2013-03-27: qty 1
  Filled 2013-03-27: qty 2
  Filled 2013-03-27: qty 1
  Filled 2013-03-27 (×3): qty 2

## 2013-03-27 MED ORDER — CEFAZOLIN SODIUM-DEXTROSE 2-3 GM-% IV SOLR: 2.0000 g | INTRAVENOUS | Status: DC

## 2013-03-27 SURGICAL SUPPLY — 72 items
BANDAGE ELASTIC 3 VELCRO ST LF (GAUZE/BANDAGES/DRESSINGS) ×1 IMPLANT
BANDAGE ELASTIC 4 VELCRO ST LF (GAUZE/BANDAGES/DRESSINGS) ×9 IMPLANT
BANDAGE GAUZE ELAST BULKY 4 IN (GAUZE/BANDAGES/DRESSINGS) ×3 IMPLANT
BLADE LONG MED 31X9 (MISCELLANEOUS) ×3 IMPLANT
BLADE SURG 15 STRL LF DISP TIS (BLADE) ×2 IMPLANT
BLADE SURG 15 STRL SS (BLADE) ×3
BLADE SURG ROTATE 9660 (MISCELLANEOUS) IMPLANT
BNDG CMPR 9X4 STRL LF SNTH (GAUZE/BANDAGES/DRESSINGS) ×2
BNDG COHESIVE 3X5 TAN STRL LF (GAUZE/BANDAGES/DRESSINGS) ×3 IMPLANT
BNDG ESMARK 4X9 LF (GAUZE/BANDAGES/DRESSINGS) ×3 IMPLANT
BRUSH SCRUB EZ PLAIN DRY (MISCELLANEOUS) ×1 IMPLANT
BUR GATOR 2.9 (BURR) IMPLANT
CANISTER SUCTION 2500CC (MISCELLANEOUS) ×3 IMPLANT
CLOTH BEACON ORANGE TIMEOUT ST (SAFETY) IMPLANT
CORDS BIPOLAR (ELECTRODE) ×3 IMPLANT
COVER SURGICAL LIGHT HANDLE (MISCELLANEOUS) ×3 IMPLANT
CUFF TOURNIQUET SINGLE 18IN (TOURNIQUET CUFF) ×1 IMPLANT
CUFF TOURNIQUET SINGLE 24IN (TOURNIQUET CUFF) ×3 IMPLANT
DRAIN TLS ROUND 10FR (DRAIN) IMPLANT
DRAPE OEC MINIVIEW 54X84 (DRAPES) ×6 IMPLANT
DRAPE SURG 17X11 SM STRL (DRAPES) ×3 IMPLANT
DRSG ADAPTIC 3X8 NADH LF (GAUZE/BANDAGES/DRESSINGS) ×3 IMPLANT
ELECT REM PT RETURN 9FT ADLT (ELECTROSURGICAL)
ELECTRODE REM PT RTRN 9FT ADLT (ELECTROSURGICAL) IMPLANT
FIBERLOOP 2 0 (SUTURE) ×6 IMPLANT
GAUZE SPONGE 4X4 16PLY XRAY LF (GAUZE/BANDAGES/DRESSINGS) ×3 IMPLANT
GAUZE XEROFORM 5X9 LF (GAUZE/BANDAGES/DRESSINGS) ×3 IMPLANT
GLOVE BIOGEL PI IND STRL 8.5 (GLOVE) ×2 IMPLANT
GLOVE BIOGEL PI INDICATOR 8.5 (GLOVE) ×1
GLOVE SURG ORTHO 8.0 STRL STRW (GLOVE) ×5 IMPLANT
GOWN PREVENTION PLUS XLARGE (GOWN DISPOSABLE) ×3 IMPLANT
GOWN STRL NON-REIN LRG LVL3 (GOWN DISPOSABLE) ×9 IMPLANT
KIT BASIN OR (CUSTOM PROCEDURE TRAY) ×3 IMPLANT
KIT ROOM TURNOVER OR (KITS) ×3 IMPLANT
KWIRE 4.0 X .062IN (WIRE) ×4 IMPLANT
MANIFOLD NEPTUNE II (INSTRUMENTS) IMPLANT
NDL HYPO 21X1.5 SAFETY (NEEDLE) ×1 IMPLANT
NDL SUT 6 .5 CRC .975X.05 MAYO (NEEDLE) ×2 IMPLANT
NEEDLE HYPO 21X1.5 SAFETY (NEEDLE) ×3 IMPLANT
NEEDLE HYPO 25GX1X1/2 BEV (NEEDLE) ×3 IMPLANT
NEEDLE HYPO 25X1 1.5 SAFETY (NEEDLE) ×3 IMPLANT
NEEDLE MAYO TAPER (NEEDLE) ×3
NS IRRIG 1000ML POUR BTL (IV SOLUTION) ×3 IMPLANT
PACK ORTHO EXTREMITY (CUSTOM PROCEDURE TRAY) ×3 IMPLANT
PAD ARMBOARD 7.5X6 YLW CONV (MISCELLANEOUS) ×6 IMPLANT
PAD CAST 4YDX4 CTTN HI CHSV (CAST SUPPLIES) ×6 IMPLANT
PADDING CAST COTTON 4X4 STRL (CAST SUPPLIES) ×9
PASSER SUT SWANSON 36MM LOOP (INSTRUMENTS) ×6 IMPLANT
SET SM JOINT TUBING/CANN (CANNULA) IMPLANT
SOAP 2 % CHG 4 OZ (WOUND CARE) ×3 IMPLANT
SPLINT FIBERGLASS 4X30 (CAST SUPPLIES) ×3 IMPLANT
SPONGE GAUZE 4X4 12PLY (GAUZE/BANDAGES/DRESSINGS) ×3 IMPLANT
STRIP CLOSURE SKIN 1/2X4 (GAUZE/BANDAGES/DRESSINGS) IMPLANT
SUT ETHILON 4 0 PS 2 18 (SUTURE) ×3 IMPLANT
SUT FIBER WIRE 4.0 (SUTURE) ×3 IMPLANT
SUT FIBERWIRE 2-0 18 17.9 3/8 (SUTURE) ×9
SUT MNCRL AB 4-0 PS2 18 (SUTURE) IMPLANT
SUT PROLENE 4 0 P 3 18 (SUTURE) ×3 IMPLANT
SUT PROLENE 4 0 PS 2 18 (SUTURE) ×3 IMPLANT
SUT VIC AB 2-0 FS1 27 (SUTURE) ×3 IMPLANT
SUT VICRYL 4-0 PS2 18IN ABS (SUTURE) ×5 IMPLANT
SUTURE FIBERWR 2-0 18 17.9 3/8 (SUTURE) ×6 IMPLANT
SYR CONTROL 10ML LL (SYRINGE) IMPLANT
SYSTEM CHEST DRAIN TLS 7FR (DRAIN) IMPLANT
TOWEL OR 17X24 6PK STRL BLUE (TOWEL DISPOSABLE) ×3 IMPLANT
TOWEL OR 17X26 10 PK STRL BLUE (TOWEL DISPOSABLE) ×3 IMPLANT
TRAP DIGIT (INSTRUMENTS) IMPLANT
TUBE CONNECTING 12X1/4 (SUCTIONS) ×3 IMPLANT
TUBING CYSTO DISP (UROLOGICAL SUPPLIES) IMPLANT
UNDERPAD 30X30 INCONTINENT (UNDERPADS AND DIAPERS) ×3 IMPLANT
WATER STERILE IRR 1000ML POUR (IV SOLUTION) IMPLANT
YANKAUER SUCT BULB TIP NO VENT (SUCTIONS) ×3 IMPLANT

## 2013-03-27 NOTE — Preoperative (Signed)
Beta Blockers   Reason not to administer Beta Blockers:Not Applicable 

## 2013-03-27 NOTE — Anesthesia Procedure Notes (Signed)
Procedure Name: Intubation Date/Time: 03/27/2013 1:55 PM Performed by: Lovie Chol Pre-anesthesia Checklist: Patient identified, Emergency Drugs available, Suction available, Patient being monitored and Timeout performed Patient Re-evaluated:Patient Re-evaluated prior to inductionOxygen Delivery Method: Circle system utilized Preoxygenation: Pre-oxygenation with 100% oxygen Intubation Type: IV induction Ventilation: Mask ventilation without difficulty Laryngoscope Size: Miller and 2 Grade View: Grade I Tube type: Oral Tube size: 7.0 mm Number of attempts: 1 Airway Equipment and Method: Stylet Placement Confirmation: ETT inserted through vocal cords under direct vision,  positive ETCO2,  CO2 detector and breath sounds checked- equal and bilateral Secured at: 21 cm Tube secured with: Tape Dental Injury: Teeth and Oropharynx as per pre-operative assessment

## 2013-03-27 NOTE — Brief Op Note (Signed)
03/27/2013  1:49 PM  PATIENT:  Emily Higgins  47 y.o. female  PRE-OPERATIVE DIAGNOSIS:  RIGHT WRIST DISTAL RADIOULNAR JOINT SUBLUXATION  POST-OPERATIVE DIAGNOSIS:  SAME  PROCEDURE:  Procedure(s): RIGHT WRIST DISTAL RADIOULNAR JOINT RECONSTRUCTION WITH POSSIBLE PINNNING AND TENDON GRAFTING (Right) ARTHROSCOPY WRIST (Right)  SURGEON:  Surgeon(s) and Role:    * Sharma Covert, MD - Primary  PHYSICIAN ASSISTANT:   ASSISTANTS:GRAMIG  ANESTHESIA:   general  EBL:     BLOOD ADMINISTERED:none  DRAINS: none   LOCAL MEDICATIONS USED:  MARCAINE     SPECIMEN:  No Specimen  DISPOSITION OF SPECIMEN:  N/A  COUNTS:  YES  TOURNIQUET:    [id number] JoB ID dictation  PLAN OF CARE: Admit for overnight observation  PATIENT DISPOSITION:  PACU - hemodynamically stable.   Delay start of Pharmacological VTE agent (>24hrs) due to surgical blood loss or risk of bleeding: not applicable

## 2013-03-27 NOTE — Anesthesia Postprocedure Evaluation (Signed)
  Anesthesia Post-op Note  Patient: Emily Higgins  Procedure(s) Performed: Procedure(s): RIGHT WRIST DISTAL RADIOULNAR JOINT RECONSTRUCTION WITH POSSIBLE PINNNING AND TENDON GRAFTING (Right)  Patient Location: PACU  Anesthesia Type:General  Level of Consciousness: awake  Airway and Oxygen Therapy: Patient Spontanous Breathing  Post-op Pain: mild  Post-op Assessment: Post-op Vital signs reviewed  Post-op Vital Signs: stable  Complications: No apparent anesthesia complications

## 2013-03-27 NOTE — Transfer of Care (Signed)
Immediate Anesthesia Transfer of Care Note  Patient: Emily Higgins  Procedure(s) Performed: Procedure(s): RIGHT WRIST DISTAL RADIOULNAR JOINT RECONSTRUCTION WITH POSSIBLE PINNNING AND TENDON GRAFTING (Right)  Patient Location: PACU  Anesthesia Type:General  Level of Consciousness: awake and alert   Airway & Oxygen Therapy: Patient Spontanous Breathing and Patient connected to nasal cannula oxygen  Post-op Assessment: Report given to PACU RN, Post -op Vital signs reviewed and stable and Patient moving all extremities X 4  Post vital signs: Reviewed and stable  Complications: No apparent anesthesia complications

## 2013-03-27 NOTE — Anesthesia Preprocedure Evaluation (Addendum)
Anesthesia Evaluation    Airway       Dental  (+) Dental Advisory Given   Pulmonary          Cardiovascular     Neuro/Psych    GI/Hepatic   Endo/Other  Morbid obesity  Renal/GU      Musculoskeletal   Abdominal   Peds  Hematology   Anesthesia Other Findings   Reproductive/Obstetrics                          Anesthesia Physical Anesthesia Plan  ASA: II  Anesthesia Plan: General   Post-op Pain Management:    Induction: Intravenous  Airway Management Planned: Oral ETT  Additional Equipment:   Intra-op Plan:   Post-operative Plan: Extubation in OR  Informed Consent: I have reviewed the patients History and Physical, chart, labs and discussed the procedure including the risks, benefits and alternatives for the proposed anesthesia with the patient or authorized representative who has indicated his/her understanding and acceptance.   Dental advisory given  Plan Discussed with: CRNA, Anesthesiologist and Surgeon  Anesthesia Plan Comments:         Anesthesia Quick Evaluation

## 2013-03-28 MED ORDER — OXYCODONE HCL ER 20 MG PO T12A
20.0000 mg | EXTENDED_RELEASE_TABLET | Freq: Two times a day (BID) | ORAL | Status: DC
  Administered 2013-03-28 – 2013-03-29 (×2): 20 mg via ORAL
  Filled 2013-03-28 (×2): qty 1

## 2013-03-28 NOTE — Progress Notes (Deleted)
03/28/13 1300  OT G-codes **NOT FOR INPATIENT CLASS**  Functional Assessment Tool Used Clincal observation  Functional Limitation Self care  Self Care Current Status (U9811) CM  Self Care Goal Status (B1478) CM  Self Care Discharge Status [phone]) CM  Ignacia Palma, OTR/L [phone] 03/28/2013

## 2013-03-28 NOTE — Progress Notes (Signed)
03/28/13 1300  OT G-codes **NOT FOR INPATIENT CLASS**  Functional Assessment Tool Used Clincal observation  Functional Limitation Self care  Self Care Current Status [phone]) CM  Self Care Goal Status [phone]) Margarita Grizzle, OTR/L [phone] 03/28/2013

## 2013-03-28 NOTE — Op Note (Signed)
Emily Higgins, Emily Higgins               ACCOUNT NO.:  [id number]  MEDICAL RECORD NO.:  [id number]  LOCATION:  [id number]                        FACILITY:  [facility]  PHYSICIAN:  Madelynn Done, MD  DATE OF BIRTH:  1966-01-25  DATE OF PROCEDURE:  03/27/2013 DATE OF DISCHARGE:                              OPERATIVE REPORT   PREOPERATIVE DIAGNOSIS:  Right wrist distal radioulnar joint instability, chronic.  POSTOPERATIVE DIAGNOSIS:  Right wrist distal radioulnar joint instability, chronic.  ATTENDING PHYSICIAN:  Sharma Covert IV, MD, who scrubbed and present for the entire procedure.  ASSISTANT SURGEON:  Dionne Ano. Gramig, M.D., who was scrubbed and present for the entire procedure and help to assist with passage of the tendon and complex reconstruction of the distal radioulnar joint.  SURGICAL PROCEDURE: 1. Right wrist distal radioulnar joint reconstruction with a tendon     graft. 2. Right wrist, application of uniplanar external fixation. 3. Right wrist, open TFCC repair. 4. Right wrist radiographs 2 views.  SURGICAL INDICATIONS:  Emily Higgins is a right-hand dominant female who was followed in the office with persistent instability of the distal radioulnar joint.  Given the failure and the instability in the dorsal position of the distal ulna, it was recommended she undergo the above procedure.  Risks, benefits, and alternatives were discussed in detail with the patient and signed informed consent was obtained.  Risks included, but not limited to bleeding, infection, damage to nearby nerves, arteries or tendons, loss motion of wrist and digits, incomplete relief of symptoms, and need for further surgical intervention.  DESCRIPTION OF PROCEDURE:  The patient was properly identified in the preop holding area and marked with a permanent marker made on the right wrist to indicate correct operative site.  The patient was then brought back to the operating room, placed supine on the  anesthesia room table. General anesthesia was administered.  The patient tolerated this well. A well-padded tourniquet was placed on the right brachium and sealed with 1000 drape.  Right upper extremity was then prepped and draped in normal sterile fashion.  Time-out was called, correct side was identified, and procedure was then begun.  A 6 cm incision made between the fifth and sixth dorsal compartment.  The limb was then elevated and tourniquet insufflated.  Dissection was then carried down through the skin and subcutaneous tissue.  Going through the fifth dorsal compartment, the fifth dorsal compartment was then carefully entered protecting the sixth dorsal compartment.  The fourth dorsal compartment was then elevated off the dorsal aspect of the radius.  A L-shaped dorsal capsulotomy was then carried out.  The fourth dorsal compartment was then elevated.  The patient did have good TFCC tissue.  Several 2-0 FiberWire sutures were then placed in the TFCC for later repair. Following this, after it was felt that the patient did not have adequate tissue to undergo TFCC open repair, therefore the tendon reconstruction was undertaken.  The longitudinal incision made between the neurovascular bundles ulnarly and the flexor tendons radially.  The palmaris longus was then carefully identified and harvested with a tendon stripper.  The palmaris longus was then repaired using a 2-0 FiberLoop suture.  Once the tendon  was then harvested and prepared, the fourth dorsal compartment was then elevated and the dorsal margin in the sigmoid notch was then confirmed using the mini C-arm.  Using a 3.5 mm cannulated drill bit, 3.5 mm drill bit was then drilled through the volar aspect carefully protecting the neurovascular bundle exiting through the palmar incision.  Once this was carried out, the 2nd tunnel was then created along the ulnar neck through the fovea into the neck. This was confirmed using a  mini C-arm, using a similar technique.  The suture retriever was then passed through the radius tunnel, dorsal to palmar and the tendon was then retrieved.  Through the dorsal incision, a hemostat passed over the ulnar head but proximal to the TFCC passed through the palmar distal radioulnar joint and was able to grab the other end of the graft.  Both graft limbs were then passed through the ulnar tunnel.  Then, both limbs were then passed around the ulnar neck under the ECU pulling tightly.  Once this was carried out, the joint was then reduced given the size in the soft tissue.  Application of an uniplanar external fixation was then carried out placing two 0.0625 K- wires from the ulna into the radius with good purchase.  The K-wires were then cut and bent and left.  Both limbs of the suture with the joint having been reduced.  The both wounds were then placed around the ulnar neck and tied with sutures.  The wound was then irrigated.  The capsule was then closed with 2-0 FiberWire and 2-0 Vicryl suture.  The retinaculum was then closed with 2-0 Vicryl suture.  Copious wound irrigation done throughout.  Subcutaneous tissue and both incisions were then closed with 4-0 Vicryl, and the skin was closed with simple Prolene sutures.  Final radiographs were then obtained.  The patient was then placed in a long-arm splint, extubated, and taken to recovery room in good condition.  Intraoperative radiographs 2 views of the wrist did show the K-wire fixation in place in good position with the reduction of the distal radioulnar joint.  POSTPROCEDURE PLAN:  The patient will be admitted overnight for IV antibiotics, pain control, long-arm splint for a total of 6 weeks, pins out at the 6-week mark; 8 weeks, long-arm cast immobilization; short-arm cast for a total of 2 weeks; and then begin a therapy regimen around the 10-week mark very cautiously and slow with her given the soft tissue incised.   Radiographs at each visit.     Madelynn Done, MD     FWO/MEDQ  D:  03/27/2013  T:  03/28/2013  Job:  [id number]

## 2013-03-28 NOTE — Evaluation (Addendum)
Occupational Therapy Evaluation Patient Details Name: Emily Higgins MRN: [id number] DOB: January 15, 1966 Today's Date: 03/28/2013 Time: 4098-1191 OT Time Calculation (min): 37 min  OT Assessment / Plan / Recommendation History of present illness Right wrist distal radioulnar joint reconstruction with a tendon graft. Right wrist, application of uniplanar external fixation. Right wrist, open TFCC repair.   Clinical Impression   This 47 yo female admitted and underwent above presents to acute OT with decreased pain control, decreased ability to tolerate movement of digits; increased edema in hand--all affecting pt's ability to take care of herself. This is the 3rd surgery that pt has had on this arm and she also has rotator cuff issues on right side as well. Will benefit from acute OT with follow up OPOT.    OT Assessment  Patient needs continued OT Services    Follow Up Recommendations  Outpatient OT       Equipment Recommendations  None recommended by OT       Frequency  Min 3X/week    Precautions / Restrictions Precautions Precautions: None Restrictions Weight Bearing Restrictions: Yes RUE Weight Bearing: Non weight bearing   Pertinent Vitals/Pain 10/10 RUE with attempt at movement; RN made aware, repositioned, ice applied    ADL  Eating/Feeding: Set up Where Assessed - Eating/Feeding: Chair Grooming: Set up Where Assessed - Grooming: Unsupported sitting Upper Body Bathing: +1 Total assistance Where Assessed - Upper Body Bathing: Unsupported sitting Lower Body Bathing: +1 Total assistance Where Assessed - Lower Body Bathing: Unsupported sit to stand Upper Body Dressing: +1 Total assistance Where Assessed - Upper Body Dressing: Unsupported sitting Lower Body Dressing: +1 Total assistance Where Assessed - Lower Body Dressing: Unsupported sit to stand Toilet Transfer: Min Pension scheme manager Method: Sit to Barista:  (Bed to recliner 5 feet  away) Toileting - Clothing Manipulation and Hygiene: +1 Total assistance Where Assessed - Toileting Clothing Manipulation and Hygiene: Sit to stand from 3-in-1 or toilet Equipment Used:  (None) Transfers/Ambulation Related to ADLs: Min guard A sit<>stand and ambulation without AE    OT Diagnosis: Generalized weakness;Acute pain  OT Problem List: Decreased strength;Decreased range of motion;Decreased activity tolerance;Pain;Impaired UE functional use;Obesity OT Treatment Interventions: Self-care/ADL training;Therapeutic exercise;Therapeutic activities;DME and/or AE instruction;Patient/family education   OT Goals(Current goals can be found in the care plan section) Acute Rehab OT Goals Patient Stated Goal: To get pain under control OT Goal Formulation: With patient Time For Goal Achievement: 04/04/13 Potential to Achieve Goals: Good  Visit Information  Last OT Received On: 03/28/13 Assistance Needed: +1 History of Present Illness: Right wrist distal radioulnar joint reconstruction with a tendon graft. Right wrist, application of uniplanar external fixation. Right wrist, open TFCC repair.       Prior Functioning     Home Living Family/patient expects to be discharged to:: Private residence Living Arrangements: Spouse/significant other;Children Available Help at Discharge: Family;Available 24 hours/day Home Layout: One level Home Equipment: None Prior Function Level of Independence: Independent Communication Communication: No difficulties Dominant Hand: Right         Vision/Perception Vision - History Patient Visual Report: No change from baseline   Cognition  Cognition Arousal/Alertness: Awake/alert Behavior During Therapy: WFL for tasks assessed/performed Overall Cognitive Status: Within Functional Limits for tasks assessed    Extremity/Trunk Assessment Upper Extremity Assessment Upper Extremity Assessment: RUE deficits/detail RUE Deficits / Details: decreased use  throughout even in joints not affected by casting/splinting RUE Sensation:  (hypersensitive pain) RUE Coordination: decreased fine motor;decreased gross motor  Mobility Bed Mobility Bed Mobility: Supine to Sit;Sitting - Scoot to Edge of Bed Supine to Sit: 6: Modified independent (Device/Increase time);With rails;HOB elevated Sitting - Scoot to Edge of Bed: 6: Modified independent (Device/Increase time);With rail Transfers Transfers: Sit to Stand;Stand to Sit Sit to Stand: 4: Min guard;With upper extremity assist;From bed (LUE) Stand to Sit: 4: Min assist;With upper extremity assist;To chair/3-in-1 (LUE)     Exercise Other Exercises Other Exercises: Attempted RUE digit exercises; however pt with c/o severe pain with any attempt at PROM or AROM; tried washcloth roll in hand to try and work on finger passive extension, however pt could not tolerate it. Ica pack applied to see if this would help. Pt did perform AAROM with me with her right shoulder for flexion/extension and abduction/adduction--tolerated 10 reps of each      End of Session OT - End of Session Activity Tolerance: Patient limited by pain Patient left: in chair;with call bell/phone within reach;with family/visitor present Nurse Communication: Mobility status;Patient requests pain meds       Evette Georges [phone] 03/28/2013, 12:58 PM

## 2013-03-29 MED ORDER — DOCUSATE SODIUM 100 MG PO CAPS: 100.0000 mg | ORAL_CAPSULE | Freq: Two times a day (BID) | ORAL | Status: DC

## 2013-03-29 MED ORDER — METHOCARBAMOL 500 MG PO TABS: 500.0000 mg | ORAL_TABLET | Freq: Four times a day (QID) | ORAL | Status: DC

## 2013-03-29 MED ORDER — VITAMIN C 500 MG PO TABS: 500.0000 mg | ORAL_TABLET | Freq: Every day | ORAL | Status: DC

## 2013-03-29 NOTE — Discharge Summary (Signed)
Physician Discharge Summary  Patient ID: Emily Higgins MRN: [id number] DOB/AGE: Mar 22, 1966 47 y.o.  Admit date: 03/27/2013 Discharge date: 03/29/2013  Admission Diagnoses: RIGHT WRIST DISTAL RADIOULNAR JOINT SUBLUXATION Past Medical History  Diagnosis Date  . Neuromuscular disorder     numbness rt hand    Discharge Diagnoses:  Active Problems:   Right wrist pain   Surgeries: Procedure(s): RIGHT WRIST DISTAL RADIOULNAR JOINT RECONSTRUCTION WITH POSSIBLE PINNNING AND TENDON GRAFTING on 03/27/2013    Consultants:    Discharged Condition: Improved  Hospital Course: Emily Higgins is an 47 y.o. female who was admitted 03/27/2013 with a chief complaint of No chief complaint on file. , and found to have a diagnosis of RIGHT WRIST DISTAL RADIOULNAR JOINT SUBLUXATION.  They were brought to the operating room on 03/27/2013 and underwent Procedure(s): RIGHT WRIST DISTAL RADIOULNAR JOINT RECONSTRUCTION WITH POSSIBLE PINNNING AND TENDON GRAFTING.    They were given perioperative antibiotics: Anti-infectives   Start     Dose/Rate Route Frequency Ordered Stop   03/28/13 0600  ceFAZolin (ANCEF) IVPB 2 g/50 mL premix  Status:  Discontinued     2 g 100 mL/hr over 30 Minutes Intravenous On call to O.R. 03/27/13 1230 03/27/13 2000   03/28/13 0600  ceFAZolin (ANCEF) IVPB 1 g/50 mL premix  Status:  Discontinued     1 g 100 mL/hr over 30 Minutes Intravenous Every 8 hours 03/27/13 1752 03/28/13 2138   03/27/13 2200  sulfamethoxazole-trimethoprim (BACTRIM DS) 800-160 MG per tablet 1 tablet     1 tablet Oral Every 12 hours 03/27/13 2008     03/27/13 1800  ceFAZolin (ANCEF) IVPB 1 g/50 mL premix     1 g 100 mL/hr over 30 Minutes Intravenous NOW 03/27/13 1752 03/27/13 2127   03/27/13 1415  ceFAZolin (ANCEF) IVPB 1 g/50 mL premix  Status:  Discontinued     1 g 100 mL/hr over 30 Minutes Intravenous  Once 03/27/13 1408 03/27/13 2000   03/27/13 1400  sulfamethoxazole-trimethoprim (BACTRIM DS,SEPTRA DS)  800-160 MG per tablet 1 tablet  Status:  Discontinued     1 tablet Oral 2 times daily 03/27/13 1353 03/27/13 2005   03/27/13 1231  ceFAZolin (ANCEF) 2-3 GM-% IVPB SOLR    Comments:  Moore, Terri   : cabinet override      03/27/13 1231 03/27/13 1355    .  They were given sequential compression devices, early ambulation, and Other (comment)ambulation for DVT prophylaxis.  Recent vital signs: Patient Vitals for the past 24 hrs:  BP Temp Pulse Resp SpO2  03/29/13 0521 151/88 mmHg 98.9 F (37.2 C) 81 16 92 %  03/28/13 2047 122/56 mmHg 98.6 F (37 C) 75 16 94 %  03/28/13 1400 122/58 mmHg 98.5 F (36.9 C) 69 16 98 %  .  Recent laboratory studies: No results found.  Discharge Medications:     Medication List         docusate sodium 100 MG capsule  Commonly known as:  COLACE  Take 1 capsule (100 mg total) by mouth 2 (two) times daily.     HYDROmorphone 2 MG tablet  Commonly known as:  DILAUDID  Take 1 tablet (2 mg total) by mouth every 4 (four) hours as needed for moderate pain or severe pain (do not take with percocet).     methocarbamol 500 MG tablet  Commonly known as:  ROBAXIN  Take 1 tablet (500 mg total) by mouth every 6 (six) hours as needed for muscle spasms.  OMEGA 3 PO  Take 1 capsule by mouth daily.     oxyCODONE-acetaminophen 10-325 MG per tablet  Commonly known as:  PERCOCET  Take 1 tablet by mouth every 4 (four) hours as needed for pain.     oxyCODONE-acetaminophen 10-325 MG per tablet  Commonly known as:  PERCOCET  Take 1 tablet by mouth every 4 (four) hours as needed for pain.     sulfamethoxazole-trimethoprim 800-160 MG per tablet  Commonly known as:  BACTRIM DS,SEPTRA DS  Take 1 tablet by mouth 2 (two) times daily.     VITAMIN B-12 PO  Take 1 tablet by mouth daily.        Diagnostic Studies: No results found.  They benefited maximally from their hospital stay and there were no complications.     Disposition: 01-Home or Self Care       Follow-up Information   Follow up with Sharma Covert, MD. Schedule an appointment as soon as possible for a visit in 18 days.   Specialty:  Orthopedic Surgery   Contact information:   [address] [place] [address] [phone]      PT SEEN/EXAMINED ON DAY OF DISCHARGE FELT READY TO GO HOME PAIN CONTROLLED BETTER PT VOICED UNDERSTANDING OF PLAN  Signed: Sharma Covert 03/29/2013, 12:15 PM

## 2013-03-29 NOTE — Progress Notes (Signed)
Patient continued to have pain levels of 8-10 during night requiring OxyIR 10mg  q3-4 hours, Robaxin q6, and received a Percocet for breakthrough pain.  Patient was started on Oxycontin 20mg  last PM.  Elevation maintained. Attempted ice but patient does not believe ice helping d/t bulkiness of splint dressing.  Patient also has continued numbness which is not new.  Good cap refill, fingers warm.  Ortho tech asked to assess splint for tightness.  Ortho tech repositioned arm to provide optimal elevation and positioning and educated patient.  Patient also educated/reinforced active and passive ROM of fingers to aid in reducing edema and increase movement of fingers.  Pain level now at 4 after last dose of Oxy at 0545.  Continue to monitor.

## 2013-03-29 NOTE — Progress Notes (Signed)
Discharge instructions and prescriptions given and explained to patient; no questions or concerns. Patient discharged via wheelchair accompanied by RN and family.

## 2013-03-29 NOTE — Progress Notes (Signed)
Occupational Therapy Treatment Patient Details Name: Emily Higgins MRN: [id number] DOB: January 09, 1966 Today's Date: 03/29/2013 Time: 4098-1191 OT Time Calculation (min): 30 min  OT Assessment / Plan / Recommendation  History of present illness Right wrist distal radioulnar joint reconstruction with a tendon graft. Right wrist, application of uniplanar external fixation. Right wrist, open TFCC repair.   OT comments  Pt tolerated exercise program with improved pain tolerance today of 4/10. Pt reporting numbness in R digits starting at MCPs to fingertips and nursing aware and see note in chart regarding numbness. Pt performed exercises to her tolerance and also discussed self care strategies with UB dressing.   Follow Up Recommendations  Outpatient OT    Barriers to Discharge       Equipment Recommendations  None recommended by OT    Recommendations for Other Services    Frequency Min 3X/week   Progress towards OT Goals Progress towards OT goals: Progressing toward goals  Plan      Precautions / Restrictions Precautions Precautions: None Restrictions Weight Bearing Restrictions: Yes RUE Weight Bearing: Non weight bearing   Pertinent Vitals/Pain 4/10 R hand; reposition, rest    ADL  ADL Comments: Discussed dressing techniques with pt  as she declined needing to practice currently. She was able to verbalize that you don shirt over affected extremity first and has shirts that are loose, easy to manipulate. She states husband has been helping to doff pullover over her head PTA also and can continue to help. Discussed sequence for doffing shirt. Pt states she has a higher toilet at home and a vanity on the left that she can use. Pt stating much more comfortable to have  UE elevated when up to bathroom earlier so informed nursing about asking surgeon about obtaining a sling. Pt states she just returned from bed after going to the bathroom and declined any needs to practice with OT right  now for that.     OT Diagnosis:    OT Problem List:   OT Treatment Interventions:     OT Goals(current goals can now be found in the care plan section)    Visit Information  Last OT Received On: 03/29/13 Assistance Needed: +1 History of Present Illness: Right wrist distal radioulnar joint reconstruction with a tendon graft. Right wrist, application of uniplanar external fixation. Right wrist, open TFCC repair.    Subjective Data      Prior Functioning       Cognition  Cognition Arousal/Alertness: Awake/alert Behavior During Therapy: WFL for tasks assessed/performed Overall Cognitive Status: Within Functional Limits for tasks assessed    Mobility       Exercises  Other Exercises Other Exercises: Pt tolerating exercises much better today both active and active assistive. She performed X 5 shoulder flexion and abduction R UE. X10 active digit extension and flexion to tolerance with better pain control than at eval. Pt then educated on SROM of using L hand to help provide additional extension/flexion to tolerance and pt performed X 10.    Balance     End of Session OT - End of Session Activity Tolerance: Patient tolerated treatment well (tolerated with improved pain control.) Patient left: in bed;with call bell/phone within reach  GO     Lennox Laity [phone] 03/29/2013, 11:47 AM

## 2013-03-29 NOTE — Progress Notes (Signed)
Orthopedic Tech Progress Note Patient Details:  Emily Higgins 1965/05/15 [id number] Arm sling applied.  Ortho Devices Type of Ortho Device: Arm sling Ortho Device/Splint Location: Right UE Ortho Device/Splint Interventions: Application   Asia R Thompson 03/29/2013, 1:49 PM

## 2013-03-31 ENCOUNTER — Other Ambulatory Visit: Payer: BC Managed Care – PPO

## 2013-04-04 ENCOUNTER — Encounter (HOSPITAL_COMMUNITY): Payer: Self-pay | Admitting: Orthopedic Surgery

## 2014-05-01 ENCOUNTER — Emergency Department (HOSPITAL_COMMUNITY)
Admission: EM | Admit: 2014-05-01 | Discharge: 2014-05-02 | Disposition: A | Discharge status: Home / Self Care | Payer: BLUE CROSS/BLUE SHIELD | Attending: Emergency Medicine | Admitting: Emergency Medicine

## 2014-05-01 ENCOUNTER — Emergency Department (HOSPITAL_COMMUNITY): Payer: BLUE CROSS/BLUE SHIELD

## 2014-05-01 ENCOUNTER — Encounter (HOSPITAL_COMMUNITY): Payer: Self-pay | Admitting: Emergency Medicine

## 2014-05-01 DIAGNOSIS — Y998 Other external cause status: Secondary | ICD-10-CM | POA: Diagnosis not present

## 2014-05-01 DIAGNOSIS — E669 Obesity, unspecified: Secondary | ICD-10-CM | POA: Diagnosis not present

## 2014-05-01 DIAGNOSIS — Z79899 Other long term (current) drug therapy: Secondary | ICD-10-CM | POA: Diagnosis not present

## 2014-05-01 DIAGNOSIS — Z9889 Other specified postprocedural states: Secondary | ICD-10-CM | POA: Diagnosis not present

## 2014-05-01 DIAGNOSIS — Z8669 Personal history of other diseases of the nervous system and sense organs: Secondary | ICD-10-CM | POA: Diagnosis not present

## 2014-05-01 DIAGNOSIS — X58XXXA Exposure to other specified factors, initial encounter: Secondary | ICD-10-CM | POA: Diagnosis not present

## 2014-05-01 DIAGNOSIS — Y9389 Activity, other specified: Secondary | ICD-10-CM | POA: Insufficient documentation

## 2014-05-01 DIAGNOSIS — R52 Pain, unspecified: Secondary | ICD-10-CM

## 2014-05-01 DIAGNOSIS — S6991XA Unspecified injury of right wrist, hand and finger(s), initial encounter: Secondary | ICD-10-CM | POA: Insufficient documentation

## 2014-05-01 DIAGNOSIS — Y9289 Other specified places as the place of occurrence of the external cause: Secondary | ICD-10-CM | POA: Diagnosis not present

## 2014-05-01 DIAGNOSIS — M25531 Pain in right wrist: Secondary | ICD-10-CM

## 2014-05-01 HISTORY — DX: Obesity, unspecified: E66.9

## 2014-05-01 MED ORDER — OXYCODONE HCL 5 MG PO TABS
10.0000 mg | ORAL_TABLET | Freq: Once | ORAL | Status: AC
Start: 2014-05-02 — End: 2014-05-02
  Administered 2014-05-02: 10 mg via ORAL
  Filled 2014-05-01: qty 2

## 2014-05-01 MED ORDER — HYDROMORPHONE HCL 1 MG/ML IJ SOLN
1.0000 mg | Freq: Once | INTRAMUSCULAR | Status: AC
Start: 2014-05-01 — End: 2014-05-01
  Administered 2014-05-01: 1 mg via INTRAMUSCULAR
  Filled 2014-05-01: qty 1

## 2014-05-01 NOTE — ED Notes (Signed)
Pt. reports right wrist pain onset this evening , denies recent injury or strenuous activity , pt. stated history of wrist surgery .

## 2014-05-01 NOTE — ED Notes (Signed)
Orthopedic tech paged and replied to page. Will come apply splint.
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
## 2014-05-01 NOTE — Progress Notes (Signed)
Orthopedic Tech Progress Note Patient Details:  Emily Higgins 17-Dec-1965 [id number]  Ortho Devices Type of Ortho Device: Arm sling, [place]ong splint Ortho Device/Splint Interventions: Application   [name], [name] 05/01/2014, 11:55 PM

## 2014-05-01 NOTE — Discharge Instructions (Signed)

## 2016-06-01 ENCOUNTER — Encounter (HOSPITAL_COMMUNITY): Payer: Self-pay

## 2016-06-01 ENCOUNTER — Emergency Department (HOSPITAL_COMMUNITY)
Admission: EM | Admit: 2016-06-01 | Discharge: 2016-06-01 | Disposition: A | Discharge status: Home / Self Care | Payer: Medicare Other | Attending: Emergency Medicine | Admitting: Emergency Medicine

## 2016-06-01 ENCOUNTER — Emergency Department (HOSPITAL_COMMUNITY): Payer: Medicare Other

## 2016-06-01 DIAGNOSIS — Y999 Unspecified external cause status: Secondary | ICD-10-CM | POA: Diagnosis not present

## 2016-06-01 DIAGNOSIS — S46912A Strain of unspecified muscle, fascia and tendon at shoulder and upper arm level, left arm, initial encounter: Secondary | ICD-10-CM | POA: Insufficient documentation

## 2016-06-01 DIAGNOSIS — Y929 Unspecified place or not applicable: Secondary | ICD-10-CM | POA: Insufficient documentation

## 2016-06-01 DIAGNOSIS — Z5181 Encounter for therapeutic drug level monitoring: Secondary | ICD-10-CM | POA: Insufficient documentation

## 2016-06-01 DIAGNOSIS — X58XXXA Exposure to other specified factors, initial encounter: Secondary | ICD-10-CM | POA: Diagnosis not present

## 2016-06-01 DIAGNOSIS — Y939 Activity, unspecified: Secondary | ICD-10-CM | POA: Diagnosis not present

## 2016-06-01 DIAGNOSIS — Z79899 Other long term (current) drug therapy: Secondary | ICD-10-CM | POA: Insufficient documentation

## 2016-06-01 DIAGNOSIS — R930 Abnormal findings on diagnostic imaging of skull and head, not elsewhere classified: Secondary | ICD-10-CM | POA: Diagnosis not present

## 2016-06-01 DIAGNOSIS — S4992XA Unspecified injury of left shoulder and upper arm, initial encounter: Secondary | ICD-10-CM | POA: Diagnosis present

## 2016-06-01 LAB — COMPREHENSIVE METABOLIC PANEL
ALT: 12 U/L — ABNORMAL LOW (ref 14–54)
AST: 17 U/L (ref 15–41)
Albumin: 3.6 g/dL (ref 3.5–5.0)
Alkaline Phosphatase: 68 U/L (ref 38–126)
Anion gap: 11 (ref 5–15)
BUN: 7 mg/dL (ref 6–20)
CO2: 23 mmol/L (ref 22–32)
Calcium: 8.8 mg/dL — ABNORMAL LOW (ref 8.9–10.3)
Chloride: 102 mmol/L (ref 101–111)
Creatinine, Ser: 0.72 mg/dL (ref 0.44–1.00)
GFR calc Af Amer: >60 mL/min (ref >60)
GFR calc non Af Amer: >60 mL/min (ref >60)
Glucose, Bld: 125 mg/dL — ABNORMAL HIGH (ref 65–99)
Potassium: 3.7 mmol/L (ref 3.5–5.1)
Sodium: 136 mmol/L (ref 135–145)
Total Bilirubin: 0.2 mg/dL — ABNORMAL LOW (ref 0.3–1.2)
Total Protein: 7 g/dL (ref 6.5–8.1)

## 2016-06-01 LAB — I-STAT CHEM 8, ED
BUN: 7 mg/dL (ref 6–20)
Calcium, Ion: 1.07 mmol/L — ABNORMAL LOW (ref 1.15–1.40)
Chloride: 102 mmol/L (ref 101–111)
Creatinine, Ser: 0.6 mg/dL (ref 0.44–1.00)
Glucose, Bld: 123 mg/dL — ABNORMAL HIGH (ref 65–99)
HCT: 41 % (ref 36.0–46.0)
Hemoglobin: 13.9 g/dL (ref 12.0–15.0)
Potassium: 3.7 mmol/L (ref 3.5–5.1)
Sodium: 139 mmol/L (ref 135–145)
TCO2: 23 mmol/L (ref 0–100)

## 2016-06-01 LAB — CBC
HCT: 39.7 % (ref 36.0–46.0)
Hemoglobin: 12.8 g/dL (ref 12.0–15.0)
MCH: 26.8 pg (ref 26.0–34.0)
MCHC: 32.2 g/dL (ref 30.0–36.0)
MCV: 83.1 fL (ref 78.0–100.0)
Platelets: 253 10*3/uL (ref 150–400)
RBC: 4.78 MIL/uL (ref 3.87–5.11)
RDW: 15.2 % (ref 11.5–15.5)
WBC: 5.3 10*3/uL (ref 4.0–10.5)

## 2016-06-01 LAB — PROTIME-INR
INR: 1.01
Prothrombin Time: 13.3 seconds (ref 11.4–15.2)

## 2016-06-01 LAB — DIFFERENTIAL
Basophils Absolute: 0 10*3/uL (ref 0.0–0.1)
Basophils Relative: 0 %
Eosinophils Absolute: 0.4 10*3/uL (ref 0.0–0.7)
Eosinophils Relative: 7 %
Lymphocytes Relative: 46 %
Lymphs Abs: 2.5 10*3/uL (ref 0.7–4.0)
Monocytes Absolute: 0.3 10*3/uL (ref 0.1–1.0)
Monocytes Relative: 5 %
Neutro Abs: 2.2 10*3/uL (ref 1.7–7.7)
Neutrophils Relative %: 42 %

## 2016-06-01 LAB — I-STAT TROPONIN, ED: Troponin i, poc: 0 ng/mL (ref 0.00–0.08)

## 2016-06-01 LAB — CBG MONITORING, ED: Glucose-Capillary: 108 mg/dL — ABNORMAL HIGH (ref 65–99)

## 2016-06-01 LAB — APTT: aPTT: 37 seconds — ABNORMAL HIGH (ref 24–36)

## 2016-06-01 MED ORDER — DIAZEPAM 5 MG PO TABS: 2.5000 mg | ORAL_TABLET | Freq: Four times a day (QID) | ORAL | 0 refills | Status: DC | PRN

## 2016-06-01 MED ORDER — HYDROMORPHONE HCL 2 MG/ML IJ SOLN
1.0000 mg | Freq: Once | INTRAMUSCULAR | Status: AC
  Administered 2016-06-01: 1 mg via INTRAMUSCULAR
  Filled 2016-06-01: qty 1

## 2016-06-01 MED ORDER — OXYCODONE-ACETAMINOPHEN 5-325 MG PO TABS: 1.0000 | ORAL_TABLET | Freq: Four times a day (QID) | ORAL | 0 refills | Status: DC | PRN

## 2016-06-01 MED ORDER — DEXAMETHASONE 4 MG PO TABS: 4.0000 mg | ORAL_TABLET | Freq: Two times a day (BID) | ORAL | 0 refills | Status: DC

## 2016-06-01 MED ORDER — DEXAMETHASONE 4 MG PO TABS
12.0000 mg | ORAL_TABLET | Freq: Once | ORAL | Status: AC
  Administered 2016-06-01: 12 mg via ORAL
  Filled 2016-06-01: qty 3

## 2016-06-01 MED ORDER — MELOXICAM 15 MG PO TABS: 15.0000 mg | ORAL_TABLET | Freq: Every day | ORAL | 0 refills | Status: DC | PRN

## 2016-06-01 MED ORDER — LORAZEPAM 1 MG PO TABS
1.0000 mg | ORAL_TABLET | Freq: Once | ORAL | Status: AC
  Administered 2016-06-01: 1 mg via ORAL
  Filled 2016-06-01: qty 1

## 2016-06-01 MED ORDER — KETOROLAC TROMETHAMINE 30 MG/ML IJ SOLN
30.0000 mg | Freq: Once | INTRAMUSCULAR | Status: AC
  Administered 2016-06-01: 30 mg via INTRAMUSCULAR
  Filled 2016-06-01: qty 1

## 2016-06-01 NOTE — ED Notes (Signed)
Papers and medications reviewed with patient and understanding verbalized

## 2016-06-01 NOTE — ED Notes (Signed)
Pt CBG was 108, notified Molly(RN)

## 2016-06-01 NOTE — ED Notes (Signed)
Pt. Back in the bed and appears less anxious and reports decreased pain

## 2016-06-01 NOTE — ED Notes (Addendum)
Patient states her pain is a 10/10 and she appears tearful and anxious at this time and requesting to sit on the side of the side of bed for more comfort

## 2016-06-01 NOTE — ED Triage Notes (Signed)
Pt reports left arm numbness and tingling that began yesterday. She denies chest pain, nausea or vomiting. Pt does endorse some mild dizziness. Pt alert and oriented X4.

## 2016-06-14 NOTE — ED Provider Notes (Signed)
[facility]-EMERGENCY DEPT Provider Note   CSN: [id number] Arrival date & time: 06/01/16  4098     History   Chief Complaint Chief Complaint  Patient presents with  . Tingling    HPI Emily Higgins is a 51 y.o. female.  HPI   51 year old female with left shoulder and left upper extremity pain. Gradual onset yesterday. Constant restless since then. She denies any acute trauma. She has pain at rest is exacerbated by movement. Radiates down into her left hand and fingers. No headaches. No chest pain. No dyspnea.  Past Medical History:  Diagnosis Date  . Neuromuscular disorder (HCC)    numbness rt hand  . Obesity     Patient Active Problem List   Diagnosis Date Noted  . Right wrist pain 03/27/2013    Past Surgical History:  Procedure Laterality Date  . TENDON REPAIR Right 11/13/2012   Procedure: RIGHT WRIST ECU(EXTENSOR CARPI ULNARIS) TENDON STABILIZATION ;  Surgeon: Sharma Covert, MD;  Location: [facility];  Service: Orthopedics;  Laterality: Right;  . TUBAL LIGATION    . 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 WRIST ARTHRODESIS  3/14   tendon repair rt wrist  . WRIST FUSION Right 03/27/2013   Procedure: RIGHT WRIST DISTAL RADIOULNAR JOINT RECONSTRUCTION WITH POSSIBLE PINNNING AND TENDON GRAFTING;  Surgeon: Sharma Covert, MD;  Location: [facility];  Service: Orthopedics;  Laterality: Right;    OB History    No data available       Home Medications    Prior to Admission medications   Medication Sig Start Date End Date Taking? Authorizing Provider  CALCIUM PO Take 1 tablet by mouth daily.   Yes Historical Provider, MD  DM-Doxylamine-Acetaminophen (NYQUIL COLD & FLU PO) Take 1 capsule by mouth daily as needed (cold symptoms).   Yes Historical Provider, MD  ibuprofen (ADVIL,MOTRIN) 200 MG tablet Take 200 mg by mouth every 6 (six) hours as needed for moderate pain.   Yes Historical Provider, MD  Iron-Vitamins (GERITOL PO) Take 1 tablet by mouth daily.   Yes  Historical Provider, MD  naproxen sodium (PAMPRIN ALL DAY RELIEF MAX ST) 220 MG tablet Take 220 mg by mouth 2 (two) times daily as needed (pain).   Yes Historical Provider, MD  oxyCODONE-acetaminophen (PERCOCET) 10-325 MG per tablet Take 1 tablet by mouth every 4 (four) hours as needed for pain. 03/27/13  Yes Bradly Bienenstock, MD  vitamin C (ASCORBIC ACID) 500 MG tablet Take 1 tablet (500 mg total) by mouth daily. 03/29/13  Yes Bradly Bienenstock, MD  dexamethasone (DECADRON) 4 MG tablet Take 1 tablet (4 mg total) by mouth 2 (two) times daily. 06/01/16   Raeford Razor, MD  diazepam (VALIUM) 5 MG tablet Take 0.5-1 tablets (2.5-5 mg total) by mouth every 6 (six) hours as needed for muscle spasms. 06/01/16   Raeford Razor, MD  meloxicam (MOBIC) 15 MG tablet Take 1 tablet (15 mg total) by mouth daily as needed for pain. 06/01/16   Raeford Razor, MD  oxyCODONE-acetaminophen (PERCOCET/ROXICET) 5-325 MG tablet Take 1-2 tablets by mouth every 6 (six) hours as needed for severe pain. 06/01/16   Raeford Razor, MD    Family History Family History  Problem Relation Age of Onset  . Diabetes Mother   . Cancer Mother     Breast  . Hypertension Mother     Social History Social History  Substance Use Topics  . Smoking status: Never Smoker  . Smokeless tobacco: Never  Used  . Alcohol use Yes     Comment: occ     Allergies   Patient has no known allergies.   Review of Systems Review of Systems  All systems reviewed and negative, other than as noted in HPI.  Physical Exam Updated Vital Signs BP 136/77   Pulse 74   Temp 98 F (36.7 C) (Oral)   Resp 19   LMP 05/28/2016 (Within Days)   SpO2 94%   Physical Exam  Constitutional: She appears well-developed and well-nourished. No distress.  HENT:  Head: Normocephalic and atraumatic.  Eyes: Conjunctivae are normal. Right eye exhibits no discharge. Left eye exhibits no discharge.  Neck: Neck supple.  Cardiovascular: Normal rate, regular rhythm and normal heart  sounds.  Exam reveals no gallop and no friction rub.   No murmur heard. Pulmonary/Chest: Effort normal and breath sounds normal. No respiratory distress.  Abdominal: Soft. She exhibits no distension. There is no tenderness.  Musculoskeletal: She exhibits no edema or tenderness.  Trunk and left upper extremity versus a normal appearance. No concerning skin lesions noted. Severe pain with range of motion of the left shoulder, operatively abduction past 90. Pain is not really reproducible with palpation. Neurovascular intact distally.  Neurological: She is alert.  Skin: Skin is warm and dry.  Psychiatric: She has a normal mood and affect. Her behavior is normal. Thought content normal.  Nursing note and vitals reviewed.    ED Treatments / Results  Labs (all labs ordered are listed, but only abnormal results are displayed) Labs Reviewed  APTT - Abnormal; Notable for the following:       Result Value   aPTT 37 (*)    All other components within normal limits  COMPREHENSIVE METABOLIC PANEL - Abnormal; Notable for the following:    Glucose, Bld 125 (*)    Calcium 8.8 (*)    ALT 12 (*)    Total Bilirubin 0.2 (*)    All other components within normal limits  CBG MONITORING, ED - Abnormal; Notable for the following:    Glucose-Capillary 108 (*)    All other components within normal limits  I-STAT CHEM 8, ED - Abnormal; Notable for the following:    Glucose, Bld 123 (*)    Calcium, Ion 1.07 (*)    All other components within normal limits  PROTIME-INR  CBC  DIFFERENTIAL  I-STAT TROPOININ, ED    EKG  EKG Interpretation  Date/Time:  Thursday June 01 2016 08:20:16 EST Ventricular Rate:  109 PR Interval:  140 QRS Duration: 82 QT Interval:  340 QTC Calculation: 457 R Axis:   68 Text Interpretation:  Sinus tachycardia Cannot rule out Anterior infarct , age undetermined Non-specific ST-t changes Confirmed by Juleen ChinaKOHUT  MD, Tali Cleaves [phone]([id number]) on 06/01/2016 11:25:08 AM        Radiology No results found.  Procedures Procedures (including critical care time)  Medications Ordered in ED Medications  ketorolac (TORADOL) 30 MG/ML injection 30 mg (30 mg Intramuscular Given 06/01/16 1033)  HYDROmorphone (DILAUDID) injection 1 mg (1 mg Intramuscular Given 06/01/16 1033)  dexamethasone (DECADRON) tablet 12 mg (12 mg Oral Given 06/01/16 1033)  LORazepam (ATIVAN) tablet 1 mg (1 mg Oral Given 06/01/16 1033)     Initial Impression / Assessment and Plan / ED Course  I have reviewed the triage vital signs and the nursing notes.  Pertinent labs & imaging results that were available during my care of the patient were reviewed by me and considered in my medical decision  making (see chart for details).     50yF with pain and numbness in LUE. Symptoms consistent with neuropathy/radiculopathy.   Final Clinical Impressions(s) / ED Diagnoses   Final diagnoses:  Strain of left shoulder, initial encounter    New Prescriptions Discharge Medication List as of 06/01/2016 12:02 PM    START taking these medications   Details  dexamethasone (DECADRON) 4 MG tablet Take 1 tablet (4 mg total) by mouth 2 (two) times daily., Starting Thu 06/01/2016, Print    diazepam (VALIUM) 5 MG tablet Take 0.5-1 tablets (2.5-5 mg total) by mouth every 6 (six) hours as needed for muscle spasms., Starting Thu 06/01/2016, Print    meloxicam (MOBIC) 15 MG tablet Take 1 tablet (15 mg total) by mouth daily as needed for pain., Starting Thu 06/01/2016, Print    oxyCODONE-acetaminophen (PERCOCET/ROXICET) 5-325 MG tablet Take 1-2 tablets by mouth every 6 (six) hours as needed for severe pain., Starting Thu 06/01/2016, Print         Raeford Razor, MD 06/14/16 [phone]

## 2017-03-10 ENCOUNTER — Emergency Department (HOSPITAL_COMMUNITY)
Admission: EM | Admit: 2017-03-10 | Discharge: 2017-03-10 | Disposition: A | Discharge status: Home / Self Care | Payer: Medicare Other | Attending: Emergency Medicine | Admitting: Emergency Medicine

## 2017-03-10 ENCOUNTER — Emergency Department (HOSPITAL_COMMUNITY): Payer: Medicare Other

## 2017-03-10 ENCOUNTER — Other Ambulatory Visit: Payer: Self-pay

## 2017-03-10 ENCOUNTER — Encounter (HOSPITAL_COMMUNITY): Payer: Self-pay | Admitting: Emergency Medicine

## 2017-03-10 DIAGNOSIS — M67912 Unspecified disorder of synovium and tendon, left shoulder: Secondary | ICD-10-CM | POA: Diagnosis not present

## 2017-03-10 DIAGNOSIS — Z79899 Other long term (current) drug therapy: Secondary | ICD-10-CM | POA: Insufficient documentation

## 2017-03-10 DIAGNOSIS — M25512 Pain in left shoulder: Secondary | ICD-10-CM | POA: Diagnosis present

## 2017-03-10 MED ORDER — CYCLOBENZAPRINE HCL 10 MG PO TABS: 10.0000 mg | ORAL_TABLET | Freq: Two times a day (BID) | ORAL | 0 refills | Status: DC | PRN

## 2017-03-10 MED ORDER — MELOXICAM 15 MG PO TABS: 15.0000 mg | ORAL_TABLET | Freq: Every day | ORAL | 0 refills | Status: DC

## 2017-03-10 MED ORDER — OXYCODONE-ACETAMINOPHEN 5-325 MG PO TABS
1.0000 | ORAL_TABLET | Freq: Once | ORAL | Status: AC
  Administered 2017-03-10: 1 via ORAL
  Filled 2017-03-10: qty 1

## 2017-03-10 MED ORDER — KETOROLAC TROMETHAMINE 60 MG/2ML IM SOLN
60.0000 mg | Freq: Once | INTRAMUSCULAR | Status: AC
  Administered 2017-03-10: 60 mg via INTRAMUSCULAR
  Filled 2017-03-10: qty 2

## 2017-03-10 MED ORDER — CAPSAICIN 0.075 % EX CREA: 1.0000 "application " | TOPICAL_CREAM | Freq: Two times a day (BID) | CUTANEOUS | 0 refills | Status: DC

## 2017-03-10 NOTE — ED Notes (Signed)
Patient transported to X-ray 

## 2017-03-10 NOTE — ED Notes (Signed)
Declined W/C at D/C and was escorted to lobby by RN. 
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
## 2017-03-10 NOTE — ED Triage Notes (Signed)
Pt. Stated, Emily Higgins had left shoulder pain for 2 weeks, no medication I take has worked.

## 2017-03-10 NOTE — ED Notes (Signed)
Patient transported to CT 

## 2017-03-10 NOTE — Discharge Instructions (Signed)
Wear the sling as needed for comfort.  Take 1 tablet of meloxicam daily with food to help with pain and inflammation.  Please do not take Motrin, ibuprofen, Advil, or naproxen while taking this medication.  You may also alternate with Tylenol, 650 mg once every 6-8 hours.  Apply capsaicin cream to areas that are sore twice daily.  You may take 1 tablet of Flexeril up to 2 times daily as needed for muscle pain and spasms.  This medication can make you drowsy so please do not take if you have to work or drive.  You can begin to stretch the muscles of your left shoulder, back, and upper arm as your pain allows.  Apply ice or heat for 15-20 minutes up to 3-4 times a day to help with pain and inflammation.  If you develop new or worsening symptoms, including chest pain, shortness of breath, or numbness or tingling in the left hand, please return to the emergency department for reevaluation.

## 2017-03-12 ENCOUNTER — Encounter (HOSPITAL_COMMUNITY): Payer: Self-pay | Admitting: *Deleted

## 2017-03-12 DIAGNOSIS — M25512 Pain in left shoulder: Secondary | ICD-10-CM | POA: Insufficient documentation

## 2017-03-12 DIAGNOSIS — Z79899 Other long term (current) drug therapy: Secondary | ICD-10-CM | POA: Insufficient documentation

## 2017-03-12 MED ORDER — OXYCODONE-ACETAMINOPHEN 5-325 MG PO TABS
1.0000 | ORAL_TABLET | ORAL | Status: DC | PRN
  Administered 2017-03-12: 1 via ORAL
  Filled 2017-03-12: qty 1

## 2017-03-12 NOTE — ED Notes (Signed)
Pt had xray and ct scan done on left shoulder at previous visit. Was dc home with mobic and flexeril which she states is not helping. Appears in pain at triage and is hypertensive.

## 2017-03-12 NOTE — ED Triage Notes (Signed)
Pt reports left shoulder pain for over two weeks, denies injury. Was seen here on 11/17 and prescribed medicine that she states is not helping her. No acute distress is noted at triage.

## 2017-03-13 ENCOUNTER — Emergency Department (HOSPITAL_COMMUNITY)
Admission: EM | Admit: 2017-03-13 | Discharge: 2017-03-13 | Disposition: A | Discharge status: Home / Self Care | Payer: Medicare Other | Attending: Emergency Medicine | Admitting: Emergency Medicine

## 2017-03-13 DIAGNOSIS — M25512 Pain in left shoulder: Secondary | ICD-10-CM

## 2017-03-13 MED ORDER — MORPHINE SULFATE (PF) 4 MG/ML IV SOLN
4.0000 mg | Freq: Once | INTRAVENOUS | Status: AC
  Administered 2017-03-13: 4 mg via INTRAMUSCULAR
  Filled 2017-03-13: qty 1

## 2017-03-13 MED ORDER — HYDROCODONE-ACETAMINOPHEN 5-325 MG PO TABS: 1.0000 | ORAL_TABLET | Freq: Four times a day (QID) | ORAL | 0 refills | Status: DC | PRN

## 2017-03-13 NOTE — ED Notes (Signed)
EDP at bedside  

## 2017-03-13 NOTE — Discharge Instructions (Signed)
It was my pleasure taking care of you today!   It is very important that you call your orthopedic doctor first thing in the morning to schedule a follow up appointment.   Please schedule an appointment with your primary care doctor as well for a blood pressure check.   Take Mobic (prescribed at last visit) as directed. Take pain medication prescribed today only as needed for break through, severe pain.   Return to ER for new or worsening symptoms, any additional concerns.

## 2017-03-13 NOTE — ED Provider Notes (Signed)
[facility] EMERGENCY DEPARTMENT Provider Note   CSN: [id number] Arrival date & time: 03/12/17  1704     History   Chief Complaint Chief Complaint  Patient presents with  . Shoulder Pain    HPI Emily Higgins is a 51 y.o. female.  The history is provided by the patient and medical records. No language interpreter was used.  Shoulder Pain     Emily Higgins is a 51 y.o. female who presents to the Emergency Department complaining of persistent, constant left shoulder pain x 2.5 weeks. Initially, pain was achy, but now it feels more sharp and throbbing. Pain will radiate from left shoulder down to left bicep area, but radiation of pain never crosses the elbow. Pain is worse with certain movements, better when kept still. No numbness, tingling, weakness. No chest pain, shortness of breath, abdominal pain or back pain.  Patient seen in ED yesterday where EKG was performed in NSR.  Plain film x-ray and CT of the shoulder obtained as well showing severe degenerative changes and possible underlying rotator cuff pathology.  Patient was discharged home on Mobic, Flexeril and capsaicin cream.  She tried Mobic and Flexeril with no improvement.  She has not tried capsaicin cream.  She is followed by orthopedics, Dr. Jillyn Hidden. She called his office today, but states they put her on hold for too long, so she came to ED instead. She has not scheduled an appointment with orthopedics.  Past Medical History:  Diagnosis Date  . Neuromuscular disorder (HCC)    numbness rt hand  . Obesity     Patient Active Problem List   Diagnosis Date Noted  . Right wrist pain 03/27/2013    Past Surgical History:  Procedure Laterality Date  . RIGHT WRIST DISTAL RADIOULNAR JOINT RECONSTRUCTION WITH POSSIBLE PINNNING AND TENDON GRAFTING Right 03/27/2013   Performed by Sharma Covert, MD at [facility] OR  . RIGHT WRIST ECU(EXTENSOR CARPI ULNARIS) TENDON STABILIZATION Right 11/13/2012   Performed by  Sharma Covert, MD at [facility]  . TUBAL LIGATION    . 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 WRIST ARTHRODESIS  3/14   tendon repair rt wrist    OB History    No data available       Home Medications    Prior to Admission medications   Medication Sig Start Date End Date Taking? Authorizing Provider  capsicum (ZOSTRIX) 0.075 % topical cream Apply 1 application 2 (two) times daily topically. 03/10/17  Yes McDonald, Mia A, PA-C  cyclobenzaprine (FLEXERIL) 10 MG tablet Take 1 tablet (10 mg total) 2 (two) times daily as needed by mouth for muscle spasms. 03/10/17  Yes McDonald, Mia A, PA-C  ibuprofen (ADVIL,MOTRIN) 200 MG tablet Take 200 mg by mouth every 6 (six) hours as needed for moderate pain.   Yes [provider]  Iron-Vitamins (GERITOL PO) Take 1 tablet by mouth daily.   Yes [provider]  meloxicam (MOBIC) 15 MG tablet Take 1 tablet (15 mg total) daily by mouth. 03/10/17  Yes McDonald, Mia A, PA-C  naproxen sodium (PAMPRIN ALL DAY RELIEF MAX ST) 220 MG tablet Take 220 mg by mouth 2 (two) times daily as needed (pain).   Yes [provider]  dexamethasone (DECADRON) 4 MG tablet Take 1 tablet (4 mg total) by mouth 2 (two) times daily. Patient not taking: Reported on 03/13/2017 06/01/16   Raeford Razor, MD  diazepam (VALIUM) 5 MG tablet  Take 0.5-1 tablets (2.5-5 mg total) by mouth every 6 (six) hours as needed for muscle spasms. Patient not taking: Reported on 03/13/2017 06/01/16   Raeford RazorKohut, Stephen, MD  HYDROcodone-acetaminophen (NORCO) 5-325 MG tablet Take 1 tablet every 6 (six) hours as needed by mouth for moderate pain. 03/13/17   Kyser Wandel, Chase PicketJaime Pilcher, PA-C  oxyCODONE-acetaminophen (PERCOCET) 10-325 MG per tablet Take 1 tablet by mouth every 4 (four) hours as needed for pain. Patient not taking: Reported on 03/13/2017 03/27/13   Bradly Bienenstockrtmann, Fred, MD  oxyCODONE-acetaminophen (PERCOCET/ROXICET) 5-325 MG tablet Take 1-2 tablets by mouth every 6  (six) hours as needed for severe pain. Patient not taking: Reported on 03/13/2017 06/01/16   Raeford RazorKohut, Stephen, MD  vitamin C (ASCORBIC ACID) 500 MG tablet Take 1 tablet (500 mg total) by mouth daily. Patient not taking: Reported on 03/13/2017 03/29/13   Bradly Bienenstockrtmann, Fred, MD    Family History Family History  Problem Relation Age of Onset  . Diabetes Mother   . Cancer Mother        Breast  . Hypertension Mother     Social History Social History   Tobacco Use  . Smoking status: Never Smoker  . Smokeless tobacco: Never Used  Substance Use Topics  . Alcohol use: Yes    Comment: occ  . Drug use: No     Allergies   Patient has no known allergies.   Review of Systems Review of Systems  Musculoskeletal: Positive for arthralgias and myalgias.  All other systems reviewed and are negative.    Physical Exam Updated Vital Signs BP (!) 220/112 (BP Location: Right Arm)   Pulse 87   Temp 98.4 F (36.9 C) (Oral)   Resp 16   LMP 03/04/2017   SpO2 96%   Physical Exam  Constitutional: She is oriented to person, place, and time. She appears well-developed and well-nourished. No distress.  HENT:  Head: Normocephalic and atraumatic.  Cardiovascular: Normal rate, regular rhythm and normal heart sounds.  No murmur heard. Pulmonary/Chest: Effort normal and breath sounds normal. No respiratory distress. She has no wheezes. She has no rales.  Abdominal: Soft. She exhibits no distension. There is no tenderness.  Musculoskeletal:  LUE with full ROM. Diffuse tenderness to the left shoulder. + empty can. 2+ radial pulse. Sensation intact to radial, ulnar and median nerve distributions.  Good cap refill.   Neurological: She is alert and oriented to person, place, and time.  Skin: Skin is warm and dry.  Nursing note and vitals reviewed.    ED Treatments / Results  Labs (all labs ordered are listed, but only abnormal results are displayed) Labs Reviewed - No data to display  EKG  EKG  Interpretation None       Radiology No results found.  Procedures Procedures (including critical care time)  Medications Ordered in ED Medications  oxyCODONE-acetaminophen (PERCOCET/ROXICET) 5-325 MG per tablet 1 tablet (1 tablet Oral Given 03/12/17 1745)  morphine 4 MG/ML injection 4 mg (4 mg Intramuscular Given 03/13/17 0254)     Initial Impression / Assessment and Plan / ED Course  I have reviewed the triage vital signs and the nursing notes.  Pertinent labs & imaging results that were available during my care of the patient were reviewed by me and considered in my medical decision making (see chart for details).    Aaryn A Chilton SiGreen is a 51 y.o. female who presents to ED for persistent left shoulder pain x 2.5 weeks. On exam, patient with tenderness to the  left shoulder. Pain also reproducible with certain movements. Appears to be musk in etiology. Seen in ED on 11/17 for same with CT of the shoulder showing no acute abnormalities, but moderate to severe AC degenerative changes and findings suggestive of chronic rotator cuff pathology. EKG performed at this visit NSR. Given pain medication in ED and symptoms improved. BP is markedly elevated in ED today. Patient denies hx of HTN or being on BP medications. BP at last ED encounter 170's/110's. BP equal on both extremities. No chest pain, shortness of breath, abdominal pain, back pain, headaches, visual changes, diaphoresis or nausea. Doubt dissection, DVT, ACS. Spoke with patient at length about strict return precautions. She agrees to call orthopedics in the morning to arrange follow up for shoulder pain and call PCP to arrange follow up for BP check. All questions answered.   Patient seen by and discussed with Dr. Bebe ShaggyWickline who agrees with treatment plan.    Final Clinical Impressions(s) / ED Diagnoses   Final diagnoses:  Acute pain of left shoulder    ED Discharge Orders        Ordered    HYDROcodone-acetaminophen (NORCO)  5-325 MG tablet  Every 6 hours PRN     03/13/17 0329       Tonita Bills, Chase PicketJaime Pilcher, PA-C 03/13/17 0421    Zadie RhineWickline, Donald, MD 03/13/17 [phone]

## 2017-03-26 ENCOUNTER — Ambulatory Visit (INDEPENDENT_AMBULATORY_CARE_PROVIDER_SITE_OTHER): Payer: Medicare Other | Admitting: Orthopaedic Surgery

## 2017-03-26 ENCOUNTER — Encounter (INDEPENDENT_AMBULATORY_CARE_PROVIDER_SITE_OTHER): Payer: Self-pay | Admitting: Orthopaedic Surgery

## 2017-03-26 DIAGNOSIS — M25512 Pain in left shoulder: Secondary | ICD-10-CM

## 2017-03-26 MED ORDER — CYCLOBENZAPRINE HCL 10 MG PO TABS: 10.0000 mg | ORAL_TABLET | Freq: Two times a day (BID) | ORAL | 0 refills | Status: DC | PRN

## 2017-03-26 MED ORDER — LIDOCAINE HCL 1 % IJ SOLN
3.0000 mL | INTRAMUSCULAR | Status: AC | PRN
Start: 2017-03-26 — End: 2017-03-26
  Administered 2017-03-26: 3 mL

## 2017-03-26 MED ORDER — METHYLPREDNISOLONE ACETATE 40 MG/ML IJ SUSP
40.0000 mg | INTRAMUSCULAR | Status: AC | PRN
  Administered 2017-03-26: 40 mg via INTRA_ARTICULAR

## 2017-03-26 MED ORDER — HYDROCODONE-ACETAMINOPHEN 5-325 MG PO TABS: 1.0000 | ORAL_TABLET | Freq: Three times a day (TID) | ORAL | 0 refills | Status: DC | PRN
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
## 2017-03-28 ENCOUNTER — Other Ambulatory Visit (INDEPENDENT_AMBULATORY_CARE_PROVIDER_SITE_OTHER): Payer: Self-pay

## 2017-03-28 DIAGNOSIS — Z96612 Presence of left artificial shoulder joint: Secondary | ICD-10-CM

## 2017-03-30 ENCOUNTER — Telehealth (INDEPENDENT_AMBULATORY_CARE_PROVIDER_SITE_OTHER): Payer: Self-pay | Admitting: Orthopaedic Surgery

## 2017-03-30 MED ORDER — ACETAMINOPHEN-CODEINE #3 300-30 MG PO TABS: ORAL_TABLET | ORAL | 0 refills | Status: DC

## 2017-03-30 NOTE — Telephone Encounter (Signed)
Called rx to pharmacy. Patient advised done.

## 2017-03-30 NOTE — Telephone Encounter (Signed)
Patient called asking for a refill on hydrocodone. CB # [phone]

## 2017-03-30 NOTE — Telephone Encounter (Signed)
I would tell her that you can call in tylenol #3 with codeine, 1-2 every 8-12 hours as needed for pain, #60. But we can't keep her on hydrocodone due to narcotics laws and hopefully the injection I gave her Monday will kick in this weekend.

## 2017-03-30 NOTE — Addendum Note (Signed)
Addended byPrescott Parma: Keino Placencia on: 03/30/2017 01:04 PM   Modules accepted: Orders

## 2017-03-30 NOTE — Telephone Encounter (Signed)
Can you advise if ok to refill? Just had filled #10 by Dr Magnus IvanBlackman on 12/03.

## 2017-03-30 NOTE — Telephone Encounter (Signed)
Defer to blackman or gil

## 2017-03-30 NOTE — Telephone Encounter (Signed)
Patient request refill on norco. I asked Dr Roda ShuttersXu about this refill and he said to hold for you. Please advise. Thanks.

## 2017-04-05 ENCOUNTER — Telehealth (INDEPENDENT_AMBULATORY_CARE_PROVIDER_SITE_OTHER): Payer: Self-pay | Admitting: Orthopaedic Surgery

## 2017-04-05 NOTE — Telephone Encounter (Signed)
Patient would like to know if she needs to come in on the 17th? Her MRI appt is on the 18th and she wanted to know if she should come after that to get it done all at one time. His next appt available is 12/31 and she didn't know if she should wait. Please advise patient what she should do CB # [phone]

## 2017-04-05 NOTE — Telephone Encounter (Signed)
No, she doesn't need to come before her MRI She can come a couple days after her MRI to make time for us to get the results back-not sameday

## 2017-04-05 NOTE — Telephone Encounter (Signed)
Scheduled patient for 12/31. Can you put her on cancellation/wait list if anything comes open on 12/20 or 12/27? Thanks!

## 2017-04-09 ENCOUNTER — Ambulatory Visit (INDEPENDENT_AMBULATORY_CARE_PROVIDER_SITE_OTHER): Payer: Medicare Other | Admitting: Orthopaedic Surgery

## 2017-04-10 ENCOUNTER — Other Ambulatory Visit (INDEPENDENT_AMBULATORY_CARE_PROVIDER_SITE_OTHER): Payer: Self-pay

## 2017-04-10 ENCOUNTER — Ambulatory Visit
Admission: RE | Admit: 2017-04-10 | Discharge: 2017-04-10 | Disposition: A | Discharge status: Home / Self Care | Payer: Medicare Other | Source: Ambulatory Visit | Attending: Orthopaedic Surgery | Admitting: Orthopaedic Surgery

## 2017-04-10 ENCOUNTER — Telehealth (INDEPENDENT_AMBULATORY_CARE_PROVIDER_SITE_OTHER): Payer: Self-pay | Admitting: Orthopaedic Surgery

## 2017-04-10 DIAGNOSIS — Z96612 Presence of left artificial shoulder joint: Secondary | ICD-10-CM

## 2017-04-10 MED ORDER — DIAZEPAM 5 MG PO TABS: ORAL_TABLET | ORAL | 0 refills | Status: DC

## 2017-04-10 NOTE — Telephone Encounter (Signed)
Can we re-order her MRI with OPEN, and I will call her about Valium. Thanks

## 2017-04-10 NOTE — Telephone Encounter (Signed)
Valium called in  Patient aware of that and that we will call her back to reschedule MRI

## 2017-04-10 NOTE — Telephone Encounter (Signed)
Patient came in the office stating that [facility] and she was unable to complete her MRI due to severe claustrophobia.  She is wanting to know her options as far as an open MRI or getting something for her to relax if Dr. Magnus IvanBlackman really needs her to have an MRI.  Please advise patient.  CB#[phone].  Thank you.

## 2017-04-11 NOTE — Telephone Encounter (Signed)
Pt aware can call [facility] to reschedule appt

## 2017-04-13 ENCOUNTER — Other Ambulatory Visit (INDEPENDENT_AMBULATORY_CARE_PROVIDER_SITE_OTHER): Payer: Self-pay | Admitting: Orthopaedic Surgery

## 2017-04-13 DIAGNOSIS — M25512 Pain in left shoulder: Principal | ICD-10-CM

## 2017-04-13 DIAGNOSIS — G8929 Other chronic pain: Secondary | ICD-10-CM

## 2017-04-22 ENCOUNTER — Ambulatory Visit
Admission: RE | Admit: 2017-04-22 | Discharge: 2017-04-22 | Disposition: A | Discharge status: Home / Self Care | Payer: Medicare Other | Source: Ambulatory Visit | Attending: Orthopaedic Surgery | Admitting: Orthopaedic Surgery

## 2017-04-22 ENCOUNTER — Other Ambulatory Visit: Payer: Medicare Other

## 2017-04-22 DIAGNOSIS — M25512 Pain in left shoulder: Principal | ICD-10-CM

## 2017-04-22 DIAGNOSIS — G8929 Other chronic pain: Secondary | ICD-10-CM

## 2017-04-23 ENCOUNTER — Ambulatory Visit (INDEPENDENT_AMBULATORY_CARE_PROVIDER_SITE_OTHER): Payer: Medicare Other | Admitting: Orthopaedic Surgery

## 2017-04-23 ENCOUNTER — Encounter (INDEPENDENT_AMBULATORY_CARE_PROVIDER_SITE_OTHER): Payer: Self-pay | Admitting: Orthopaedic Surgery

## 2017-04-23 DIAGNOSIS — M7542 Impingement syndrome of left shoulder: Secondary | ICD-10-CM | POA: Insufficient documentation

## 2017-04-23 MED ORDER — HYDROCODONE-ACETAMINOPHEN 5-325 MG PO TABS: 1.0000 | ORAL_TABLET | Freq: Three times a day (TID) | ORAL | 0 refills | Status: DC | PRN

## 2017-04-23 NOTE — Addendum Note (Signed)
Addended by: Shonna ChockMCCLINTOCK, Zaylin Runco M on: 04/23/2017 04:43 PM   Modules accepted: Orders

## 2017-04-23 NOTE — Progress Notes (Signed)
The patient comes in today for review of her MRI of her left shoulder.  She is been having severe shoulder pain for just a short period of time and is gotten significantly worse.  She is tried and failed all forms of conservative treatment including rest, ice, heat, home exercise program, anti-inflammatories and narcotic pain medication as well as a steroid injection.  The pain is still severe in her shoulder.  On exam she barely does move her shoulder around I think she is developing some adhesive capsulitis.  She has pain in the subacromial outlet and AC joint and there is some weakness in the shoulder itself.  The MRI does show moderate tendinopathy of the rotator cuff with some interstitial tears that are partial-thickness.  She does have some signs of bursitis of the shoulder as well as moderate AC joint arthritis and signs of adhesive capsulitis.  At this point I do recommend an arthroscopic intervention of the shoulder with subacromial decompression and extensive debridement as well as releasing the scar tissue and repairing the rotator cuff only if needed.  Most likely this will be an extensive subacromial decompression and debridement.  We would also address the distal clavicle at that time arthroscopically.  I went over her shoulder model explained in detail with the surgery involved as well as thorough discussion of the risk and benefits of surgery.  Talked about her intraoperative and postoperative course as well.  She agrees to proceed with this given the amount of pain she is having in the disability and dysfunction she is having of the shoulder.  We would then see her back in 1 week postoperative.  All questions concerns were answered and addressed.

## 2017-05-01 ENCOUNTER — Other Ambulatory Visit (INDEPENDENT_AMBULATORY_CARE_PROVIDER_SITE_OTHER): Payer: Self-pay | Admitting: Physician Assistant

## 2017-05-03 ENCOUNTER — Other Ambulatory Visit (INDEPENDENT_AMBULATORY_CARE_PROVIDER_SITE_OTHER): Payer: Self-pay | Admitting: Orthopaedic Surgery

## 2017-05-03 ENCOUNTER — Telehealth (INDEPENDENT_AMBULATORY_CARE_PROVIDER_SITE_OTHER): Payer: Self-pay | Admitting: Orthopaedic Surgery

## 2017-05-03 MED ORDER — HYDROCODONE-ACETAMINOPHEN 5-325 MG PO TABS: 1.0000 | ORAL_TABLET | Freq: Three times a day (TID) | ORAL | 0 refills | Status: DC | PRN

## 2017-05-03 NOTE — Telephone Encounter (Signed)
Please advise 

## 2017-05-03 NOTE — Telephone Encounter (Signed)
Patient called asking for a refill on hydrocodone. CB # [phone]

## 2017-05-03 NOTE — Telephone Encounter (Signed)
She can come pick up a prescription for a few more hydrocodone but she really needs to try to take this sparingly because is not going to stay on narcotics for so long before any type of surgery because no medical covering for surgery even more difficult.

## 2017-05-04 NOTE — Telephone Encounter (Signed)
Patient aware Rx at front desk  

## 2017-05-07 ENCOUNTER — Other Ambulatory Visit (INDEPENDENT_AMBULATORY_CARE_PROVIDER_SITE_OTHER): Payer: Self-pay | Admitting: Physician Assistant

## 2017-05-09 ENCOUNTER — Other Ambulatory Visit: Payer: Self-pay

## 2017-05-09 ENCOUNTER — Encounter (HOSPITAL_COMMUNITY)
Admission: RE | Admit: 2017-05-09 | Discharge: 2017-05-09 | Disposition: A | Discharge status: Home / Self Care | Payer: Medicare Other | Source: Ambulatory Visit | Attending: Orthopaedic Surgery | Admitting: Orthopaedic Surgery

## 2017-05-09 ENCOUNTER — Encounter (HOSPITAL_COMMUNITY): Payer: Self-pay

## 2017-05-09 DIAGNOSIS — M7542 Impingement syndrome of left shoulder: Secondary | ICD-10-CM | POA: Diagnosis not present

## 2017-05-09 DIAGNOSIS — Z01818 Encounter for other preprocedural examination: Secondary | ICD-10-CM | POA: Diagnosis present

## 2017-05-09 HISTORY — DX: Impingement syndrome of left shoulder: M75.42

## 2017-05-09 LAB — CBC
HEMATOCRIT: 36.3 % (ref 36.0–46.0)
Hemoglobin: 11.2 g/dL — ABNORMAL LOW (ref 12.0–15.0)
MCH: 24.4 pg — ABNORMAL LOW (ref 26.0–34.0)
MCHC: 30.9 g/dL (ref 30.0–36.0)
MCV: 79.1 fL (ref 78.0–100.0)
PLATELETS: 313 10*3/uL (ref 150–400)
RBC: 4.59 MIL/uL (ref 3.87–5.11)
RDW: 16.4 % — AB (ref 11.5–15.5)
WBC: 5 10*3/uL (ref 4.0–10.5)

## 2017-05-09 LAB — HCG, SERUM, QUALITATIVE: PREG SERUM: NEGATIVE

## 2017-05-09 NOTE — Progress Notes (Signed)
Pt denies SOB, chest pain, and being under the care of a cardiologist. Pt denies having a stress test, echo and cardiac cath. Pt denies having a chest x ray within the last year. Pt denies recent labs. 

## 2017-05-09 NOTE — Pre-Procedure Instructions (Signed)
    Emily Higgins  05/09/2017      [facility] [phone] [place], [place] - [address] AT [facility] [address] [address] [place] [id number] Phone: [phone] Fax: [phone]    Your procedure is scheduled on Tuesday, May 15, 2017  [facility] at 12:20 PM.  Call this number if you have problems the morning of surgery:  [phone]   Remember:  Do not eat food or drink liquids after midnight Monday, May 14, 2017  Take these medicines the morning of surgery with A SIP OF WATER : if needed: HYDROcodone-acetaminophen [facility](NORCO) for pain Stop taking Aspirin, vitamins, fish oil and herbal medications. Do not take any NSAIDs ie: Ibuprofen, Advil, Naproxen (Aleve), Motrin, BC and Goody Powder; stop now.   Do not wear jewelry, make-up or nail polish.  Do not wear lotions, powders, or perfumes, or deodorant.  Do not shave 48 hours prior to surgery.    Do not bring valuables to the hospital.  [facility] is not responsible for any belongings or valuables.  Contacts, dentures or bridgework may not be worn into surgery.  Leave your suitcase in the car.  After surgery it may be brought to your room. For patients admitted to the hospital, discharge time will be determined by your treatment team. Patients discharged the day of surgery will not be allowed to drive home.  Special instructions: Shower the night before surgery and the morning of surgery with CHG. Please read over the following fact sheets that you were given. Pain Booklet, Coughing and Deep Breathing and Surgical Site Infection Prevention

## 2017-05-10 ENCOUNTER — Inpatient Hospital Stay (INDEPENDENT_AMBULATORY_CARE_PROVIDER_SITE_OTHER): Payer: Medicare Other | Admitting: Orthopaedic Surgery

## 2017-05-14 MED ORDER — DEXTROSE 5 % IV SOLN
3.0000 g | INTRAVENOUS | Status: AC
  Administered 2017-05-15: 3 g via INTRAVENOUS
  Filled 2017-05-14: qty 3

## 2017-05-15 ENCOUNTER — Encounter (HOSPITAL_COMMUNITY): Payer: Self-pay

## 2017-05-15 ENCOUNTER — Other Ambulatory Visit: Payer: Self-pay

## 2017-05-15 ENCOUNTER — Encounter (HOSPITAL_COMMUNITY)
Admission: RE | Disposition: A | Discharge status: Home / Self Care | Payer: Self-pay | Source: Ambulatory Visit | Attending: Orthopaedic Surgery

## 2017-05-15 ENCOUNTER — Ambulatory Visit (HOSPITAL_COMMUNITY): Payer: Medicare Other | Admitting: Certified Registered Nurse Anesthetist

## 2017-05-15 ENCOUNTER — Observation Stay (HOSPITAL_COMMUNITY)
Admission: RE | Admit: 2017-05-15 | Discharge: 2017-05-16 | Disposition: A | Discharge status: Home / Self Care | Payer: Medicare Other | Source: Ambulatory Visit | Attending: Orthopaedic Surgery | Admitting: Orthopaedic Surgery

## 2017-05-15 DIAGNOSIS — Z6841 Body Mass Index (BMI) 40.0 and over, adult: Secondary | ICD-10-CM | POA: Insufficient documentation

## 2017-05-15 DIAGNOSIS — M19012 Primary osteoarthritis, left shoulder: Secondary | ICD-10-CM | POA: Insufficient documentation

## 2017-05-15 DIAGNOSIS — Z9889 Other specified postprocedural states: Secondary | ICD-10-CM | POA: Diagnosis present

## 2017-05-15 DIAGNOSIS — Z79899 Other long term (current) drug therapy: Secondary | ICD-10-CM | POA: Diagnosis not present

## 2017-05-15 DIAGNOSIS — Z833 Family history of diabetes mellitus: Secondary | ICD-10-CM | POA: Diagnosis not present

## 2017-05-15 DIAGNOSIS — Z8249 Family history of ischemic heart disease and other diseases of the circulatory system: Secondary | ICD-10-CM | POA: Diagnosis not present

## 2017-05-15 DIAGNOSIS — Z803 Family history of malignant neoplasm of breast: Secondary | ICD-10-CM | POA: Insufficient documentation

## 2017-05-15 DIAGNOSIS — I10 Essential (primary) hypertension: Secondary | ICD-10-CM | POA: Diagnosis not present

## 2017-05-15 DIAGNOSIS — M7542 Impingement syndrome of left shoulder: Secondary | ICD-10-CM | POA: Diagnosis present

## 2017-05-15 HISTORY — PX: SHOULDER ARTHROSCOPY WITH SUBACROMIAL DECOMPRESSION: SHX5684

## 2017-05-15 SURGERY — SHOULDER ARTHROSCOPY WITH SUBACROMIAL DECOMPRESSION
Anesthesia: General | Site: Shoulder | Laterality: Left

## 2017-05-15 MED ORDER — KETOROLAC TROMETHAMINE 30 MG/ML IJ SOLN: 30.0000 mg | Freq: Once | INTRAMUSCULAR | Status: DC | PRN

## 2017-05-15 MED ORDER — DIPHENHYDRAMINE HCL 12.5 MG/5ML PO ELIX: 12.5000 mg | ORAL_SOLUTION | ORAL | Status: DC | PRN

## 2017-05-15 MED ORDER — FENTANYL CITRATE (PF) 100 MCG/2ML IJ SOLN
50.0000 ug | Freq: Once | INTRAMUSCULAR | Status: AC
  Administered 2017-05-15: 50 ug via INTRAVENOUS
  Filled 2017-05-15: qty 1

## 2017-05-15 MED ORDER — ONDANSETRON HCL 4 MG PO TABS: 4.0000 mg | ORAL_TABLET | Freq: Four times a day (QID) | ORAL | Status: DC | PRN

## 2017-05-15 MED ORDER — FENTANYL CITRATE (PF) 100 MCG/2ML IJ SOLN
INTRAMUSCULAR | Status: AC
  Administered 2017-05-15: 50 ug via INTRAVENOUS
  Filled 2017-05-15: qty 2

## 2017-05-15 MED ORDER — DEXAMETHASONE SODIUM PHOSPHATE 10 MG/ML IJ SOLN
INTRAMUSCULAR | Status: DC | PRN
  Administered 2017-05-15: 10 mg via INTRAVENOUS

## 2017-05-15 MED ORDER — ONDANSETRON HCL 4 MG/2ML IJ SOLN
INTRAMUSCULAR | Status: AC
  Filled 2017-05-15: qty 2

## 2017-05-15 MED ORDER — SUCCINYLCHOLINE CHLORIDE 200 MG/10ML IV SOSY
PREFILLED_SYRINGE | INTRAVENOUS | Status: DC | PRN
  Administered 2017-05-15: 100 mg via INTRAVENOUS

## 2017-05-15 MED ORDER — HYDRALAZINE HCL 20 MG/ML IJ SOLN
10.0000 mg | Freq: Four times a day (QID) | INTRAMUSCULAR | Status: DC | PRN
  Administered 2017-05-15: 10 mg via INTRAVENOUS

## 2017-05-15 MED ORDER — PROPOFOL 10 MG/ML IV BOLUS
INTRAVENOUS | Status: DC | PRN
  Administered 2017-05-15: 270 mg via INTRAVENOUS

## 2017-05-15 MED ORDER — ROPIVACAINE HCL 5 MG/ML IJ SOLN
INTRAMUSCULAR | Status: DC | PRN
  Administered 2017-05-15: 30 mL via PERINEURAL

## 2017-05-15 MED ORDER — PHENYLEPHRINE HCL 10 MG/ML IJ SOLN
INTRAVENOUS | Status: DC | PRN
  Administered 2017-05-15: 25 ug/min via INTRAVENOUS

## 2017-05-15 MED ORDER — SODIUM CHLORIDE 0.9 % IR SOLN
Status: DC | PRN
  Administered 2017-05-15 (×2): 3000 mL

## 2017-05-15 MED ORDER — MIDAZOLAM HCL 2 MG/2ML IJ SOLN
2.0000 mg | Freq: Once | INTRAMUSCULAR | Status: AC
  Administered 2017-05-15: 2 mg via INTRAVENOUS
  Filled 2017-05-15: qty 2

## 2017-05-15 MED ORDER — PROMETHAZINE HCL 25 MG/ML IJ SOLN: 6.2500 mg | INTRAMUSCULAR | Status: DC | PRN

## 2017-05-15 MED ORDER — FENTANYL CITRATE (PF) 250 MCG/5ML IJ SOLN
INTRAMUSCULAR | Status: AC
  Filled 2017-05-15: qty 5

## 2017-05-15 MED ORDER — METOCLOPRAMIDE HCL 5 MG/ML IJ SOLN: 5.0000 mg | Freq: Three times a day (TID) | INTRAMUSCULAR | Status: DC | PRN

## 2017-05-15 MED ORDER — FENTANYL CITRATE (PF) 100 MCG/2ML IJ SOLN: 25.0000 ug | INTRAMUSCULAR | Status: DC | PRN

## 2017-05-15 MED ORDER — ONDANSETRON HCL 4 MG/2ML IJ SOLN: 4.0000 mg | Freq: Four times a day (QID) | INTRAMUSCULAR | Status: DC | PRN

## 2017-05-15 MED ORDER — DEXAMETHASONE SODIUM PHOSPHATE 10 MG/ML IJ SOLN
INTRAMUSCULAR | Status: AC
  Filled 2017-05-15: qty 1

## 2017-05-15 MED ORDER — HYDROMORPHONE HCL 1 MG/ML IJ SOLN: 1.0000 mg | INTRAMUSCULAR | Status: DC | PRN

## 2017-05-15 MED ORDER — CHLORHEXIDINE GLUCONATE 4 % EX LIQD: 60.0000 mL | Freq: Once | CUTANEOUS | Status: DC

## 2017-05-15 MED ORDER — METOCLOPRAMIDE HCL 5 MG PO TABS: 5.0000 mg | ORAL_TABLET | Freq: Three times a day (TID) | ORAL | Status: DC | PRN

## 2017-05-15 MED ORDER — LACTATED RINGERS IV SOLN: INTRAVENOUS | Status: DC

## 2017-05-15 MED ORDER — PROPOFOL 10 MG/ML IV BOLUS
INTRAVENOUS | Status: AC
  Filled 2017-05-15: qty 20

## 2017-05-15 MED ORDER — METHOCARBAMOL 1000 MG/10ML IJ SOLN: 500.0000 mg | Freq: Four times a day (QID) | INTRAVENOUS | Status: DC | PRN

## 2017-05-15 MED ORDER — ESMOLOL HCL 100 MG/10ML IV SOLN
INTRAVENOUS | Status: DC | PRN
  Administered 2017-05-15: 30 mg via INTRAVENOUS

## 2017-05-15 MED ORDER — HYDROCODONE-ACETAMINOPHEN 5-325 MG PO TABS: 1.0000 | ORAL_TABLET | ORAL | Status: DC | PRN

## 2017-05-15 MED ORDER — HYDRALAZINE HCL 20 MG/ML IJ SOLN: 5.0000 mg | Freq: Once | INTRAMUSCULAR | Status: DC

## 2017-05-15 MED ORDER — HYDRALAZINE HCL 20 MG/ML IJ SOLN
INTRAMUSCULAR | Status: AC
Start: 2017-05-15 — End: 2017-05-15
  Administered 2017-05-15: 5 mg
  Filled 2017-05-15: qty 1

## 2017-05-15 MED ORDER — ACETAMINOPHEN 650 MG RE SUPP: 650.0000 mg | RECTAL | Status: DC | PRN

## 2017-05-15 MED ORDER — MIDAZOLAM HCL 2 MG/2ML IJ SOLN
INTRAMUSCULAR | Status: DC | PRN
  Administered 2017-05-15: 2 mg via INTRAVENOUS

## 2017-05-15 MED ORDER — METHOCARBAMOL 500 MG PO TABS
500.0000 mg | ORAL_TABLET | Freq: Four times a day (QID) | ORAL | Status: DC | PRN
  Administered 2017-05-16 (×2): 500 mg via ORAL
  Filled 2017-05-15 (×2): qty 1

## 2017-05-15 MED ORDER — MIDAZOLAM HCL 2 MG/2ML IJ SOLN
INTRAMUSCULAR | Status: AC
  Administered 2017-05-15: 2 mg via INTRAVENOUS
  Filled 2017-05-15: qty 2

## 2017-05-15 MED ORDER — CEFAZOLIN SODIUM-DEXTROSE 1-4 GM/50ML-% IV SOLN
1.0000 g | Freq: Four times a day (QID) | INTRAVENOUS | Status: DC
  Administered 2017-05-15 – 2017-05-16 (×2): 1 g via INTRAVENOUS
  Filled 2017-05-15 (×2): qty 50

## 2017-05-15 MED ORDER — SODIUM CHLORIDE 0.9 % IV SOLN: INTRAVENOUS | Status: DC

## 2017-05-15 MED ORDER — LACTATED RINGERS IV SOLN
INTRAVENOUS | Status: DC
  Administered 2017-05-15 (×3): via INTRAVENOUS

## 2017-05-15 MED ORDER — FENTANYL CITRATE (PF) 100 MCG/2ML IJ SOLN
INTRAMUSCULAR | Status: DC | PRN
  Administered 2017-05-15: 50 ug via INTRAVENOUS

## 2017-05-15 MED ORDER — MIDAZOLAM HCL 2 MG/2ML IJ SOLN
INTRAMUSCULAR | Status: AC
  Filled 2017-05-15: qty 2

## 2017-05-15 MED ORDER — SUGAMMADEX SODIUM 200 MG/2ML IV SOLN
INTRAVENOUS | Status: DC | PRN
  Administered 2017-05-15: 275 mg via INTRAVENOUS

## 2017-05-15 MED ORDER — ACETAMINOPHEN 325 MG PO TABS: 650.0000 mg | ORAL_TABLET | ORAL | Status: DC | PRN

## 2017-05-15 MED ORDER — BUPIVACAINE-EPINEPHRINE (PF) 0.5% -1:200000 IJ SOLN
INTRAMUSCULAR | Status: AC
  Filled 2017-05-15: qty 30

## 2017-05-15 MED ORDER — OXYCODONE HCL 5 MG PO TABS
10.0000 mg | ORAL_TABLET | ORAL | Status: DC | PRN
  Administered 2017-05-15 – 2017-05-16 (×4): 10 mg via ORAL
  Filled 2017-05-15 (×4): qty 2

## 2017-05-15 MED ORDER — LIDOCAINE 2% (20 MG/ML) 5 ML SYRINGE
INTRAMUSCULAR | Status: DC | PRN
  Administered 2017-05-15: 100 mg via INTRAVENOUS

## 2017-05-15 MED ORDER — ROCURONIUM BROMIDE 100 MG/10ML IV SOLN
INTRAVENOUS | Status: DC | PRN
  Administered 2017-05-15: 30 mg via INTRAVENOUS

## 2017-05-15 MED ORDER — SUGAMMADEX SODIUM 200 MG/2ML IV SOLN
INTRAVENOUS | Status: AC
  Filled 2017-05-15: qty 2

## 2017-05-15 SURGICAL SUPPLY — 53 items
BLADE CLIPPER SURG (BLADE) IMPLANT
BLADE CUDA 4.2 (BLADE) ×3 IMPLANT
BLADE SURG 11 STRL SS (BLADE) ×3 IMPLANT
BUR VERTEX HOODED 4.5 (BURR) IMPLANT
CANNULA 5.75X7 CRYSTAL CLEAR (CANNULA) IMPLANT
CANNULA SHOULDER 7CM (CANNULA) ×6 IMPLANT
CANNULA TWIST IN 8.25X7CM (CANNULA) IMPLANT
CLOSURE WOUND 1/2 X4 (GAUZE/BANDAGES/DRESSINGS)
COVER SURGICAL LIGHT HANDLE (MISCELLANEOUS) ×3 IMPLANT
DECANTER SPIKE VIAL GLASS SM (MISCELLANEOUS) IMPLANT
DRAPE INCISE IOBAN 66X45 STRL (DRAPES) IMPLANT
DRAPE SHOULDER BEACH CHAIR (DRAPES) ×3 IMPLANT
DRAPE SURG 17X23 STRL (DRAPES) ×3 IMPLANT
DRAPE U-SHAPE 47X51 STRL (DRAPES) ×3 IMPLANT
DRSG PAD ABDOMINAL 8X10 ST (GAUZE/BANDAGES/DRESSINGS) ×6 IMPLANT
DURAPREP 26ML APPLICATOR (WOUND CARE) ×3 IMPLANT
ELECT REM PT RETURN 9FT ADLT (ELECTROSURGICAL)
ELECTRODE REM PT RTRN 9FT ADLT (ELECTROSURGICAL) IMPLANT
GAUZE SPONGE 4X4 12PLY STRL (GAUZE/BANDAGES/DRESSINGS) ×3 IMPLANT
GAUZE XEROFORM 1X8 LF (GAUZE/BANDAGES/DRESSINGS) ×3 IMPLANT
GLOVE BIO SURGEON STRL SZ8 (GLOVE) ×3 IMPLANT
GLOVE BIOGEL PI IND STRL 8 (GLOVE) ×1 IMPLANT
GLOVE BIOGEL PI INDICATOR 8 (GLOVE) ×2
GLOVE ORTHO TXT STRL SZ7.5 (GLOVE) ×3 IMPLANT
GOWN STRL REUS W/ TWL LRG LVL3 (GOWN DISPOSABLE) ×2 IMPLANT
GOWN STRL REUS W/ TWL XL LVL3 (GOWN DISPOSABLE) ×4 IMPLANT
GOWN STRL REUS W/TWL LRG LVL3 (GOWN DISPOSABLE) ×6
GOWN STRL REUS W/TWL XL LVL3 (GOWN DISPOSABLE) ×12
KIT BASIN OR (CUSTOM PROCEDURE TRAY) ×3 IMPLANT
KIT ROOM TURNOVER OR (KITS) ×3 IMPLANT
KIT SHOULDER TRACTION (DRAPES) ×3 IMPLANT
MANIFOLD NEPTUNE II (INSTRUMENTS) ×3 IMPLANT
NEEDLE 1/2 CIR CATGUT .05X1.09 (NEEDLE) IMPLANT
NEEDLE HYPO 25GX1X1/2 BEV (NEEDLE) IMPLANT
NEEDLE SCORPION (NEEDLE) IMPLANT
NEEDLE SPNL 18GX3.5 QUINCKE PK (NEEDLE) ×3 IMPLANT
NS IRRIG 1000ML POUR BTL (IV SOLUTION) ×3 IMPLANT
PACK SHOULDER (CUSTOM PROCEDURE TRAY) ×3 IMPLANT
PAD ARMBOARD 7.5X6 YLW CONV (MISCELLANEOUS) ×6 IMPLANT
SET ARTHROSCOPY TUBING (MISCELLANEOUS) ×3
SET ARTHROSCOPY TUBING LN (MISCELLANEOUS) ×1 IMPLANT
SLING ARM FOAM STRAP LRG (SOFTGOODS) ×3 IMPLANT
SPONGE LAP 4X18 X RAY DECT (DISPOSABLE) ×3 IMPLANT
STAPLER VISISTAT 35W (STAPLE) IMPLANT
STRIP CLOSURE SKIN 1/2X4 (GAUZE/BANDAGES/DRESSINGS) IMPLANT
SUT ETHILON 3 0 PS 1 (SUTURE) IMPLANT
SYR CONTROL 10ML LL (SYRINGE) IMPLANT
TOWEL OR 17X24 6PK STRL BLUE (TOWEL DISPOSABLE) ×3 IMPLANT
TOWEL OR 17X26 10 PK STRL BLUE (TOWEL DISPOSABLE) ×3 IMPLANT
WAND HAND CNTRL MULTIVAC 90 (MISCELLANEOUS) ×3 IMPLANT
WAND STAR VAC 90 (SURGICAL WAND) ×3 IMPLANT
WAND SUCTION MAX 4MM 90S (SURGICAL WAND) ×3 IMPLANT
WATER STERILE IRR 1000ML POUR (IV SOLUTION) ×3 IMPLANT

## 2017-05-15 NOTE — Plan of Care (Signed)
  Progressing Safety: Ability to remain free from injury will improve 05/15/2017 2146 - Progressing by Mel AlmondAguilar, Kyrel Leighton D, RN Education: Knowledge of the prescribed therapeutic regimen will improve 05/15/2017 2146 - Progressing by Mel AlmondAguilar, Beila Purdie D, RN Understanding of activity limitations/precautions following surgery will improve 05/15/2017 2146 - Progressing by Mel AlmondAguilar, Willella Harding D, RN Activity: Ability to tolerate increased activity will improve 05/15/2017 2146 - Progressing by Mel AlmondAguilar, Dalana Pfahler D, RN Pain Management: Pain level will decrease with appropriate interventions 05/15/2017 2146 - Progressing by Mel AlmondAguilar, Chairty Toman D, RN

## 2017-05-15 NOTE — Transfer of Care (Signed)
Immediate Anesthesia Transfer of Care Note  Patient: Emily Higgins  Procedure(s) Performed: LEFT SHOULDER ARTHROSCOPY WITH EXTENSIVE DEBRIDEMENT AND SUBACROMIAL DECOMPRESSION (Left Shoulder)  Patient Location: PACU  Anesthesia Type:General  Level of Consciousness: awake, alert  and oriented  Airway & Oxygen Therapy: Patient Spontanous Breathing and Patient connected to nasal cannula oxygen  Post-op Assessment: Report given to RN and Post -op Vital signs reviewed and stable  Post vital signs: Reviewed and stable  Last Vitals:  Vitals:   05/15/17 1415 05/15/17 1634  BP: (!) 160/56 (!) 163/102  Pulse: (!) 102 (!) 112  Resp: 18 19  Temp:  36.8 C  SpO2: 100% 100%    Last Pain:  Vitals:   05/15/17 1410  TempSrc:   PainSc: 0-No pain      Patients Stated Pain Goal: 3 (05/15/17 1350)  Complications: No apparent anesthesia complications reports comfort with regional anesthesia present

## 2017-05-15 NOTE — Anesthesia Procedure Notes (Signed)
Anesthesia Procedure Image    

## 2017-05-15 NOTE — H&P (Signed)
Emily Higgins is an 52 y.o. female.   Chief Complaint: Severe left shoulder pain HPI: The patient is a 52 year old pleasant female with severe worsening left shoulder pain over the last few months.  She is tried and failed all forms of conservative treatment including rest, ice, heat, anti-inflammatories, narcotic pain medications and injections.  She is worked on try to get her shoulder moving.  We did obtain an MRI of the shoulder showed severe impingement syndrome with partial thickness rotator cuff tearing.  Given the failure of conservative treatment surgeries been recommended at this point.  Past Medical History:  Diagnosis Date  . Impingement syndrome of left shoulder    severe  . Neuromuscular disorder (HCC)    numbness rt hand  . Obesity     Past Surgical History:  Procedure Laterality Date  . TENDON REPAIR Right 11/13/2012   Procedure: RIGHT WRIST ECU(EXTENSOR CARPI ULNARIS) TENDON STABILIZATION ;  Surgeon: Sharma CovertFred W Ortmann, MD;  Location: [facility];  Service: Orthopedics;  Laterality: Right;  . TUBAL LIGATION    . 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 WISDOM TOOTH EXTRACTION    . WRIST ARTHRODESIS  3/14   tendon repair rt wrist  . WRIST FUSION Right 03/27/2013   Procedure: RIGHT WRIST DISTAL RADIOULNAR JOINT RECONSTRUCTION WITH POSSIBLE PINNNING AND TENDON GRAFTING;  Surgeon: Sharma CovertFred W Ortmann, MD;  Location: [facility];  Service: Orthopedics;  Laterality: Right;    Family History  Problem Relation Age of Onset  . Diabetes Mother   . Cancer Mother        Breast  . Hypertension Mother    Social History:  reports that  has never smoked. she has never used smokeless tobacco. She reports that she drinks alcohol. She reports that she does not use drugs.  Allergies: No Known Allergies  Medications Prior to Admission  Medication Sig Dispense Refill  . HYDROcodone-acetaminophen (NORCO) 5-325 MG tablet Take 1 tablet by mouth 3 (three) times daily as needed for  moderate pain. 20 tablet 0  . ibuprofen (ADVIL,MOTRIN) 200 MG tablet Take 400 mg by mouth every 6 (six) hours as needed for headache or moderate pain.     Marland Kitchen. acetaminophen-codeine (TYLENOL #3) 300-30 MG tablet 1-2 po q every 8-12hrs prn pain (Patient not taking: Reported on 05/04/2017) 60 tablet 0  . capsicum (ZOSTRIX) 0.075 % topical cream Apply 1 application 2 (two) times daily topically. (Patient not taking: Reported on 05/04/2017) 28.3 g 0  . cyclobenzaprine (FLEXERIL) 10 MG tablet Take 1 tablet (10 mg total) by mouth 2 (two) times daily as needed for muscle spasms. (Patient not taking: Reported on 05/04/2017) 20 tablet 0  . diazepam (VALIUM) 5 MG tablet Take one tab by mouth ONE hour prior to MRI, repeat as needed. (Patient not taking: Reported on 05/04/2017) 2 tablet 0  . meloxicam (MOBIC) 15 MG tablet Take 1 tablet (15 mg total) daily by mouth. (Patient not taking: Reported on 05/04/2017) 14 tablet 0  . oxyCODONE-acetaminophen (PERCOCET) 10-325 MG per tablet Take 1 tablet by mouth every 4 (four) hours as needed for pain. (Patient not taking: Reported on 03/13/2017) 40 tablet 0  . oxyCODONE-acetaminophen (PERCOCET/ROXICET) 5-325 MG tablet Take 1-2 tablets by mouth every 6 (six) hours as needed for severe pain. (Patient not taking: Reported on 03/13/2017) 12 tablet 0  . vitamin C (ASCORBIC ACID) 500 MG tablet Take 1 tablet (500 mg total) by mouth daily. (Patient not taking: Reported on 03/13/2017) 50  tablet 0    No results found for this or any previous visit (from the past 48 hour(s)). No results found.  Review of Systems  All other systems reviewed and are negative.   Last menstrual period 04/29/2017. Physical Exam  Constitutional: She is oriented to person, place, and time. She appears well-developed and well-nourished.  HENT:  Head: Normocephalic and atraumatic.  Eyes: Pupils are equal, round, and reactive to light.  Neck: Normal range of motion.  Cardiovascular: Normal rate.   Respiratory: Effort normal.  GI: Soft.  Musculoskeletal:       Left shoulder: She exhibits decreased range of motion, tenderness, bony tenderness and decreased strength.  Neurological: She is oriented to person, place, and time.  Skin: Skin is warm.  Psychiatric: She has a normal mood and affect.     Assessment/Plan Severe impingement syndrome of the left shoulder  We plan to proceed to surgery today for a left shoulder arthroscopy with extensive debridement and subacromial decompression.  She understands that if we find any significant rotator cuff tearing that we will repair this if needed.  We had a thorough discussion about the risk and benefits of the surgery.  Given the failure of conservative treatment she does wish to proceed.  All questions concerns were answered and addressed.  Kathryne Hitch, MD 05/15/2017, 12:06 PM

## 2017-05-15 NOTE — Op Note (Signed)
NAMEMARLAYNA, Higgins               ACCOUNT NO.:  [id number]  MEDICAL RECORD NO.:  [id number]  LOCATION:  3C11C                        FACILITY:  [facility]  PHYSICIAN:  Vanita Panda. Magnus Ivan, M.D.DATE OF BIRTH:  January 09, 1966  DATE OF PROCEDURE:  05/15/2017 DATE OF DISCHARGE:                              OPERATIVE REPORT   PREOPERATIVE DIAGNOSIS:  Left shoulder severe impingement syndrome with partial thickness rotator cuff tear and acromioclavicular arthritis.  POSTOPERATIVE DIAGNOSIS:  Left shoulder severe impingement syndrome with partial thickness rotator cuff tear and acromioclavicular arthritis.  PROCEDURE:  Left shoulder arthroscopy with extensive debridement and subacromial decompression.  SURGEON:  Vanita Panda. Magnus Ivan, MD.  ASSISTANT:  Richardean Canal, PA-C.  ANESTHESIA: 1. Left shoulder regional anesthesia with a block. 2. General.  BLOOD LOSS:  Minimal.  COMPLICATIONS:  None.  INDICATIONS:  [name] is a 52 year old morbidly obese female, weighing in excess of 300 pounds.  She also has poorly-controlled hypertension. She has had severe debilitating left shoulder pain for some time now and has required extensive narcotics to help ease her pain.  We have tried injections in her shoulder and it did not help at all.  We set her up for an MRI that showed significant impingement in the shoulder.  With the failure of conservative treatment, we recommended arthroscopic intervention.  The risks and benefits of this were explained to her in detail and she did wish to proceed with surgery given the failure of conservative treatment.  PROCEDURE DESCRIPTION:  After informed consent was obtained, appropriate left shoulder was marked.  Anesthesia was obtained with regional anesthesia, block.  She was brought to the operating room and placed supine on the operating table.  General anesthesia was then obtained. She was then fashioned in a beach chair position with  appropriate positioning of the head and neck and padding of the down on operative right arm.  Her left shoulder was placed in in-line skeletal traction after prepping and draping it with DuraPrep and sterile drapes.  It was 10 pounds of traction with 45 degrees of forward flexion, neutral rotation.  A time-out was called and she was identified as correct patient and correct left shoulder.  I then made a posterolateral arthroscopy portal and had difficulty getting to the glenohumeral joint due to her morbid obesity, but we were able to get to the glenohumeral joint.  Surprisingly, we did not find any significant inflamed tissue in the glenohumeral joint and we made an anterior portal and the rotator interval and carried out a debridement of the subacromial space with inflamed and frayed tissue at the subscapularis tendon and the undersurface of the rotator cuff as well as the rotator interval.  The cartilages on the humeral head and glenoid were intact as was the labrum.  The biceps tendon also looked good.  We then entered the subacromial space of the posterior portal and made 2 lateral portals. It was difficult getting down to her shoulder due to her obesity again and once we were able to mobilize in the subacromial space, we found significant bursitis and tendinosis of the rotator cuff.  This did require extensive debridement.  Once we were able to do this, we found  the rotator cuff to be attached and intact.  We cleaned inflamed tissue underneath the acromion and all around the rotator cuff in general.  We could not get to the acromioclavicular joint due to her neck size and shoulder obesity to perform any type of distal clavicle resection.  We then allowed fluid to lavage through her shoulder and we drained all fluid from the shoulder.  We closed all portal sites with interrupted nylon suture.  Xeroform and well-padded sterile dressing were applied. She was awakened, extubated, and  taken to the recovery room in stable condition.  All final counts were correct.  There were no complications noted.  We did place the arm in a sling.  Postoperatively, we are going to keep her overnight just due to her poorly controlled blood pressure and let her go tomorrow.  Of note, Sullivan LoneGilbert, PA-C, assisted in the entire case.  His assistance was crucial for facilitating all aspects of this case.     Vanita Pandahristopher Y. Magnus IvanBlackman, M.D.     CYB/MEDQ  D:  05/15/2017  T:  05/15/2017  Job:  [id number]

## 2017-05-15 NOTE — Brief Op Note (Signed)
05/15/2017  4:20 PM  PATIENT:  Emily Higgins  52 y.o. female  PRE-OPERATIVE DIAGNOSIS:  severe impingement syndrome left shoulder  POST-OPERATIVE DIAGNOSIS:  severe impingement syndrome left shoulder  PROCEDURE:  Procedure(s): LEFT SHOULDER ARTHROSCOPY WITH EXTENSIVE DEBRIDEMENT AND SUBACROMIAL DECOMPRESSION (Left)  SURGEON:  Surgeon(s) and Role:    Kathryne Hitch* Frederik Standley Y, MD - Primary  PHYSICIAN ASSISTANT: Rexene EdisonGil Clark, PA-C  ANESTHESIA:   regional and general  COUNTS:  YES  DICTATION: .Other Dictation: Dictation Number [id number]  PLAN OF CARE: Admit for overnight observation  PATIENT DISPOSITION:  PACU - hemodynamically stable.   Delay start of Pharmacological VTE agent (>24hrs) due to surgical blood loss or risk of bleeding: no

## 2017-05-15 NOTE — Anesthesia Preprocedure Evaluation (Signed)
Anesthesia Evaluation  Patient identified by MRN, date of birth, ID band Patient awake    Reviewed: Allergy & Precautions, NPO status , Patient's Chart, lab work & pertinent test results  Airway Mallampati: III  TM Distance: <3 FB Neck ROM: Full    Dental no notable dental hx.    Pulmonary neg pulmonary ROS,    breath sounds clear to auscultation + decreased breath sounds      Cardiovascular hypertension, Normal cardiovascular exam Rhythm:Regular Rate:Normal  Untreated HTN   Neuro/Psych negative neurological ROS  negative psych ROS   GI/Hepatic negative GI ROS, Neg liver ROS,   Endo/Other  Morbid obesity  Renal/GU negative Renal ROS  negative genitourinary   Musculoskeletal negative musculoskeletal ROS (+)   Abdominal (+) + obese,   Peds negative pediatric ROS (+)  Hematology negative hematology ROS (+)   Anesthesia Other Findings   Reproductive/Obstetrics negative OB ROS                             Anesthesia Physical Anesthesia Plan  ASA: III  Anesthesia Plan: General   Post-op Pain Management:  Regional for Post-op pain   Induction: Intravenous  PONV Risk Score and Plan: 3 and Ondansetron, Dexamethasone and Midazolam  Airway Management Planned: Oral ETT  Additional Equipment:   Intra-op Plan:   Post-operative Plan: Extubation in OR  Informed Consent: I have reviewed the patients History and Physical, chart, labs and discussed the procedure including the risks, benefits and alternatives for the proposed anesthesia with the patient or authorized representative who has indicated his/her understanding and acceptance.   Dental advisory given  Plan Discussed with: CRNA and Surgeon  Anesthesia Plan Comments:         Anesthesia Quick Evaluation

## 2017-05-15 NOTE — Anesthesia Procedure Notes (Addendum)
Procedure Name: Intubation Date/Time: 05/15/2017 3:01 PM Performed by: Jodell Ciproato, Jenniferlynn Saad A, CRNA Pre-anesthesia Checklist: Patient identified, Emergency Drugs available, Suction available and Patient being monitored Patient Re-evaluated:Patient Re-evaluated prior to induction Oxygen Delivery Method: Circle System Utilized Preoxygenation: Pre-oxygenation with 100% oxygen Induction Type: IV induction Ventilation: Mask ventilation without difficulty Laryngoscope Size: Glidescope and 3 Grade View: Grade I Tube type: Oral Tube size: 7.5 mm Number of attempts: 1 Airway Equipment and Method: Stylet and Oral airway Placement Confirmation: ETT inserted through vocal cords under direct vision,  positive ETCO2 and breath sounds checked- equal and bilateral Secured at: 20 cm Tube secured with: Tape Dental Injury: Teeth and Oropharynx as per pre-operative assessment  Comments: glidescope used due to morbid obesity

## 2017-05-15 NOTE — Anesthesia Procedure Notes (Signed)
Anesthesia Regional Block: Interscalene brachial plexus block   Pre-Anesthetic Checklist: ,, timeout performed, Correct Patient, Correct Site, Correct Laterality, Correct Procedure, Correct Position, site marked, Risks and benefits discussed,  Surgical consent,  Pre-op evaluation,  At surgeon's request and post-op pain management  Laterality: Left  Prep: chloraprep       Needles:  Injection technique: Single-shot  Needle Type: Echogenic Needle     Needle Length: 9cm      Additional Needles:   Procedures:,,,, ultrasound used (permanent image in chart),,,,  Narrative:  Start time: 05/15/2017 1:50 PM End time: 05/15/2017 2:00 PM Injection made incrementally with aspirations every 5 mL.  Performed by: Personally  Anesthesiologist: Eilene Ghaziose, Osamu Olguin, MD  Additional Notes: Patient tolerated the procedure well without complications

## 2017-05-16 ENCOUNTER — Encounter (HOSPITAL_COMMUNITY): Payer: Self-pay | Admitting: Orthopaedic Surgery

## 2017-05-16 DIAGNOSIS — M7542 Impingement syndrome of left shoulder: Secondary | ICD-10-CM | POA: Diagnosis not present

## 2017-05-16 MED ORDER — OXYCODONE-ACETAMINOPHEN 5-325 MG PO TABS: 1.0000 | ORAL_TABLET | Freq: Four times a day (QID) | ORAL | 0 refills | Status: DC | PRN

## 2017-05-16 NOTE — Discharge Summary (Signed)
Patient ID: Emily CelesteFelicia A Giarratano MRN: [id number] DOB/AGE: 01-17-1966 52 y.o.  Admit date: 05/15/2017 Discharge date: 05/16/2017  Admission Diagnoses:  Principal Problem:   Impingement syndrome of left shoulder Active Problems:   Status post arthroscopy of left shoulder   Discharge Diagnoses:  Same  Past Medical History:  Diagnosis Date  . Impingement syndrome of left shoulder    severe  . Neuromuscular disorder (HCC)    numbness rt hand  . Obesity     Surgeries: Procedure(s): LEFT SHOULDER ARTHROSCOPY WITH EXTENSIVE DEBRIDEMENT AND SUBACROMIAL DECOMPRESSION on 05/15/2017   Consultants:   Discharged Condition: Improved  Hospital Course: Emily Higgins is an 52 y.o. female who was admitted 05/15/2017 for operative treatment ofImpingement syndrome of left shoulder. Patient has severe unremitting pain that affects sleep, daily activities, and work/hobbies. After pre-op clearance the patient was taken to the operating room on 05/15/2017 and underwent  Procedure(s): LEFT SHOULDER ARTHROSCOPY WITH EXTENSIVE DEBRIDEMENT AND SUBACROMIAL DECOMPRESSION.    Patient was given perioperative antibiotics:  Anti-infectives (From admission, onward)   Start     Dose/Rate Route Frequency Ordered Stop   05/15/17 2100  ceFAZolin (ANCEF) IVPB 1 g/50 mL premix     1 g 100 mL/hr over 30 Minutes Intravenous Every 6 hours 05/15/17 1810 05/16/17 1459   05/15/17 1000  ceFAZolin (ANCEF) 3 g in dextrose 5 % 50 mL IVPB     3 g 130 mL/hr over 30 Minutes Intravenous To Short Stay 05/14/17 0738 05/15/17 1449       Patient was given sequential compression devices, early ambulation, and chemoprophylaxis to prevent DVT.  Patient benefited maximally from hospital stay and there were no complications.    Recent vital signs:  Patient Vitals for the past 24 hrs:  BP Temp Temp src Pulse Resp SpO2 Height Weight  05/16/17 0325 137/70 98.8 F (37.1 C) Oral (!) 103 18 97 % - -  05/15/17 2315 (!) 144/82 98.7 F  (37.1 C) Oral (!) 103 18 96 % - -  05/15/17 1945 (!) 142/91 98.8 F (37.1 C) Oral (!) 119 18 98 % - -  05/15/17 1812 (!) 161/98 98 F (36.7 C) Oral (!) 109 20 97 % - -  05/15/17 1745 (!) 147/58 (!) 97 F (36.1 C) - - - - - -  05/15/17 1730 - - - (!) 111 20 97 % - -  05/15/17 1720 (!) 173/73 - - (!) 105 18 97 % - -  05/15/17 1705 (!) 174/82 - - (!) 102 20 96 % - -  05/15/17 1700 - - - (!) 103 (!) 21 97 % - -  05/15/17 1650 (!) 175/93 - - (!) 102 19 100 % - -  05/15/17 1634 (!) 163/102 98.2 F (36.8 C) - (!) 112 19 100 % - -  05/15/17 1415 (!) 160/56 - - (!) 102 18 100 % - -  05/15/17 1410 (!) 163/80 - - (!) 110 20 100 % - -  05/15/17 1405 (!) 160/84 - - (!) 111 (!) 23 98 % - -  05/15/17 1350 (!) 160/84 - - (!) 102 16 99 % - -  05/15/17 1244 - - - - - - 5\' 1"  (1.549 m) -  05/15/17 1217 (!) 170/95 98.2 F (36.8 C) Oral (!) 113 18 97 % - (!) 302 lb (137 kg)     Recent laboratory studies: No results for input(s): WBC, HGB, HCT, PLT, NA, K, CL, CO2, BUN, CREATININE, GLUCOSE, INR, CALCIUM in the last  72 hours.  Invalid input(s): PT, 2   Discharge Medications:   Allergies as of 05/16/2017   No Known Allergies     Medication List    STOP taking these medications   acetaminophen-codeine 300-30 MG tablet Commonly known as:  TYLENOL #3   diazepam 5 MG tablet Commonly known as:  VALIUM   HYDROcodone-acetaminophen 5-325 MG tablet Commonly known as:  NORCO     TAKE these medications   capsicum 0.075 % topical cream Commonly known as:  ZOSTRIX Apply 1 application 2 (two) times daily topically.   cyclobenzaprine 10 MG tablet Commonly known as:  FLEXERIL Take 1 tablet (10 mg total) by mouth 2 (two) times daily as needed for muscle spasms.   ibuprofen 200 MG tablet Commonly known as:  ADVIL,MOTRIN Take 400 mg by mouth every 6 (six) hours as needed for headache or moderate pain.   meloxicam 15 MG tablet Commonly known as:  MOBIC Take 1 tablet (15 mg total) daily by mouth.    oxyCODONE-acetaminophen 5-325 MG tablet Commonly known as:  PERCOCET/ROXICET Take 1-2 tablets by mouth every 6 (six) hours as needed for severe pain. What changed:  Another medication with the same name was removed. Continue taking this medication, and follow the directions you see here.   vitamin C 500 MG tablet Commonly known as:  ASCORBIC ACID Take 1 tablet (500 mg total) by mouth daily.       Diagnostic Studies: Mr Shoulder Left Wo Contrast  Result Date: 04/22/2017 CLINICAL DATA:  Left shoulder pain for 2 months. EXAM: MRI OF THE LEFT SHOULDER WITHOUT CONTRAST TECHNIQUE: Multiplanar, multisequence MR imaging of the shoulder was performed. No intravenous contrast was administered. COMPARISON:  CT left shoulder 03/10/2017 FINDINGS: Rotator cuff: Moderate rotator cuff tendinopathy/tendinosis with interstitial tears involving the supraspinatus and infraspinatus tendons. There is also a shallow oblique coursing articular surface tear involving the supraspinatus tendon in its midportion and a small bursal surface tear involving the anterior attachment region. No full-thickness retracted tear. The subscapularis tendon is intact. Muscles:  Normal Biceps long head:  Intact Acromioclavicular Joint: Moderate AC joint degenerative changes with inferior spurring. Type 2 acromion. No lateral downsloping or significant undersurface spurring. Glenohumeral Joint: Mild glenohumeral joint degenerative changes with small joint effusion. There is thickening of the capsular structures in the axillary recess which can be seen with adhesive capsulitis or synovitis. Labrum:  No obvious labral tears. Bones: No acute bony findings. Subchondral cystic change noted in the humeral head near the greater tuberosity. Other: Mild subacromial/ subdeltoid bursitis. IMPRESSION: 1. Moderate rotator cuff tendinopathy/tendinosis mainly involving the supraspinatus tendon. There are interstitial tears along with shallow bursal and  articular surface tears but no full-thickness retracted tear. 2. Moderate AC joint degenerative changes with inferior spurring. 3. Intact long head biceps tendon and glenoid labrum. 4. Mild glenohumeral joint degenerative changes with small joint effusion and synovitis versus adhesive capsulitis. 5. Mild subacromial/subdeltoid bursitis. Electronically Signed   By: Rudie Meyer M.D.   On: 04/22/2017 13:35    Disposition: 01-Home or Self Care    Follow-up Information    Kathryne Hitch, MD Follow up in 1 week(s).   Specialty:  Orthopedic Surgery Contact information: [address] [place] [address] [phone]            Signed: Kathryne Hitch 05/16/2017, 7:03 AM

## 2017-05-16 NOTE — Progress Notes (Signed)
Patient alert and oriented, mae's well, voiding adequate amount of urine, swallowing without difficulty,  c/o mild pain at time of discharge. Patient discharged home with family. Script and discharged instructions given to patient. Patient and family stated understanding of instructions given. Patient has an appointment with Dr. Magnus IvanBlackman

## 2017-05-16 NOTE — Discharge Instructions (Signed)
Increase your activities as comfort allows. Come out of your sling several times daily to move your shoulder and arm. You can remove your dressings today or tomorrow and get your incisions wet daily in the shower. Place band-aids over your incisions daily.  DO GET A FOLLOW-UP APPOINTMENT WITH YOUR PRIMARY CARE PHYSICIAN TO ADDRESS YOUR HIGH BLOOD PRESSURE

## 2017-05-16 NOTE — Progress Notes (Signed)
Patient ID: Emily CelesteFelicia A Higgins, female   DOB: 09-24-1965, 52 y.o.   MRN: [id number] No acute changes.  Tolerated surgery well.  Left shoulder dressing clean and dry.  Can be discharged to home this morning.  I spoke with her about her hypertension.

## 2017-05-16 NOTE — Anesthesia Postprocedure Evaluation (Signed)
Anesthesia Post Note  Patient: Emily Higgins  Procedure(s) Performed: LEFT SHOULDER ARTHROSCOPY WITH EXTENSIVE DEBRIDEMENT AND SUBACROMIAL DECOMPRESSION (Left Shoulder)     Anesthesia Type: General    Last Vitals:  Vitals:   05/15/17 2315 05/16/17 0325  BP: (!) 144/82 137/70  Pulse: (!) 103 (!) 103  Resp: 18 18  Temp: 37.1 C 37.1 C  SpO2: 96% 97%    Last Pain:  Vitals:   05/16/17 0617  TempSrc:   PainSc: Asleep                 Blaze Nylund S

## 2017-05-22 ENCOUNTER — Other Ambulatory Visit (INDEPENDENT_AMBULATORY_CARE_PROVIDER_SITE_OTHER): Payer: Self-pay

## 2017-05-22 ENCOUNTER — Ambulatory Visit (INDEPENDENT_AMBULATORY_CARE_PROVIDER_SITE_OTHER): Payer: Medicare Other | Admitting: Orthopaedic Surgery

## 2017-05-22 ENCOUNTER — Encounter (INDEPENDENT_AMBULATORY_CARE_PROVIDER_SITE_OTHER): Payer: Self-pay | Admitting: Orthopaedic Surgery

## 2017-05-22 DIAGNOSIS — Z9889 Other specified postprocedural states: Secondary | ICD-10-CM

## 2017-05-22 DIAGNOSIS — G8929 Other chronic pain: Secondary | ICD-10-CM

## 2017-05-22 DIAGNOSIS — M25512 Pain in left shoulder: Principal | ICD-10-CM

## 2017-05-22 NOTE — Progress Notes (Signed)
Emily Higgins returns today status post left shoulder scope with arthroscopic debridement and is 79.  She is overall doing well states her range of motion is increased.  Taking hydrocodone for pain.  No fevers chills shortness of breath  Left shoulder: Port sites all healing well no signs of infection.  She has limited active forward flexion only coming about 75 degrees passively I can bring her to 100 degrees.  Limited external rotation shoulder with some pain.  Impression: 1 week status post left shoulder arthroscopy with arthroscopic debridement and SCD.  Plan: Shoulder exercises handouts given discussed shoulder exercises with her.  We will also send her to outpatient therapy to work on range of motion strengthening left shoulder.  She will follow-up with us in 1 month sooner if there is any questions concerns.  Sutures were removed from the port sites.

## 2017-05-31 ENCOUNTER — Ambulatory Visit: Payer: Medicare Other | Attending: Orthopaedic Surgery | Admitting: Physical Therapy

## 2017-05-31 ENCOUNTER — Encounter: Payer: Self-pay | Admitting: Physical Therapy

## 2017-05-31 DIAGNOSIS — M25612 Stiffness of left shoulder, not elsewhere classified: Secondary | ICD-10-CM | POA: Insufficient documentation

## 2017-05-31 DIAGNOSIS — M6281 Muscle weakness (generalized): Secondary | ICD-10-CM | POA: Diagnosis present

## 2017-05-31 DIAGNOSIS — M25512 Pain in left shoulder: Secondary | ICD-10-CM | POA: Diagnosis present

## 2017-05-31 DIAGNOSIS — R293 Abnormal posture: Secondary | ICD-10-CM | POA: Insufficient documentation

## 2017-05-31 NOTE — Therapy (Signed)
[facility] Outpatient Rehabilitation [facility] [address] [place], [place], [address] Phone: [phone]   Fax:  [phone]  Physical Therapy Evaluation  Patient Details  Name: Emily Higgins MRN: [id number] Date of Birth: [birth year] Referring Provider: Dr. Doneen Poisson   Encounter Date: 05/31/2017  PT End of Session - 05/31/17 1232    Visit Number  1    Number of Visits  16    Date for PT Re-Evaluation  07/26/17    Authorization Type  Medicare    PT Start Time  1015    PT Stop Time  1055    PT Time Calculation (min)  40 min    Activity Tolerance  Patient tolerated treatment well    Behavior During Therapy  [facility] for tasks assessed/performed       Past Medical History:  Diagnosis Date  . Impingement syndrome of left shoulder    severe  . Neuromuscular disorder (HCC)    numbness rt hand  . Obesity     Past Surgical History:  Procedure Laterality Date  . SHOULDER ARTHROSCOPY WITH SUBACROMIAL DECOMPRESSION Left 05/15/2017   Procedure: LEFT SHOULDER ARTHROSCOPY WITH EXTENSIVE DEBRIDEMENT AND SUBACROMIAL DECOMPRESSION;  Surgeon: Kathryne Hitch, MD;  [facility];  Service: Orthopedics;  Laterality: Left;  . TENDON REPAIR Right 11/13/2012   Procedure: RIGHT WRIST ECU(EXTENSOR CARPI ULNARIS) TENDON STABILIZATION ;  Surgeon: Sharma Covert, MD;  Location: [facility];  Service: Orthopedics;  Laterality: Right;  . TUBAL LIGATION    . 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 WISDOM TOOTH EXTRACTION    . WRIST ARTHRODESIS  3/14   tendon repair rt wrist  . WRIST FUSION Right 03/27/2013   Procedure: RIGHT WRIST DISTAL RADIOULNAR JOINT RECONSTRUCTION WITH POSSIBLE PINNNING AND TENDON GRAFTING;  Surgeon: Sharma Covert, MD;  [facility]: [facility] OR;  Service: Orthopedics;  Laterality: Right;    There were no vitals filed for this visit.   Subjective Assessment - 05/31/17 1017    Subjective  Pt is a 52 y/o female who presents to OPPT  s/p Lt shoulder arthroscopy with debridement and SAD on 05/15/17.  Pt reports today with continued difficulty wtih ROM and strength affecting ADLs.    Patient Stated Goals  learn correct exercises to do at home; help regain strength and motion    Currently in Pain?  Yes    Pain Score  1  up to 10/10    Pain Location  Shoulder    Pain Orientation  Left    Pain Descriptors / Indicators  Aching;Discomfort;Numbness    Pain Type  Surgical pain    Pain Onset  1 to 4 weeks ago    Pain Frequency  Intermittent    Aggravating Factors   movement; some exercises    Pain Relieving Factors  medications, ice, rest         [facility] PT Assessment - 05/31/17 1021      Assessment   Medical Diagnosis  Lt shoulder scope with SAD and debridement    Referring Provider  Dr. Doneen Poisson    Onset Date/Surgical Date  05/15/17    Hand Dominance  Right    Next MD Visit  06/20/17    Prior Therapy  none for this condition      Precautions   Precaution Comments  none      Restrictions   Weight Bearing Restrictions  No      Balance Screen   Has the  patient fallen in the past 6 months  No    Has the patient had a decrease in activity level because of a fear of falling?   Yes    Is the patient reluctant to leave their home because of a fear of falling?   No      Home Environment   Living Environment  Private residence    Living Arrangements  Spouse/significant other    Type of Home  House    Additional Comments  was doing all housework      Prior Function   Level of Independence  Independent currently getting assistance with ADLs    Vocation  On disability    Leisure  spend time with grandchildren, crafting, creating/designing greeting cards      Cognition   Overall Cognitive Status  Within Functional Limits for tasks assessed      Observation/Other Assessments   Focus on Therapeutic Outcomes (FOTO)   42 (58% limited; predicting 36% limited)      Posture/Postural Control   Posture/Postural  Control  Postural limitations    Postural Limitations  Rounded Shoulders;Forward head      ROM / Strength   AROM / PROM / Strength  AROM;PROM;Strength      AROM   AROM Assessment Site  Shoulder    Right/Left Shoulder  Left;Right    Right Shoulder Flexion  104 Degrees seated    Right Shoulder ABduction  90 Degrees seated    Left Shoulder Flexion  68 Degrees    Left Shoulder ABduction  59 Degrees    Left Shoulder Internal Rotation  55 Degrees likely can go further; unable to abduct further to measure    Left Shoulder External Rotation  27 Degrees      PROM   PROM Assessment Site  Shoulder    Right/Left Shoulder  Left    Left Shoulder Flexion  112 Degrees    Left Shoulder ABduction  74 Degrees    Left Shoulder Internal Rotation  55 Degrees    Left Shoulder External Rotation  32 Degrees      Strength   Overall Strength Comments  Lt shoulder grossly 3-/5             Objective measurements completed on examination: See above findings.      [facility] Adult PT Treatment/Exercise - 05/31/17 1021      Self-Care   Self-Care  Other Self-Care Comments    Other Self-Care Comments   reviewed pendulums provided by MD office as pt had questions about exercise. Instructed pt to bring exercises provided by MD's office to review and update      Manual Therapy   Manual Therapy  Passive ROM    Passive ROM  Lt shoulder flexion, external rotation x 6 min with end range holds; pt very guarded and limited by pain             PT Education - 05/31/17 1232    Education provided  Yes    Education Details  see self-care    Person(s) Educated  Patient    Methods  Explanation    Comprehension  Verbalized understanding       PT Short Term Goals - 05/31/17 1246      PT SHORT TERM GOAL #1   Title  independent with initial HEP    Status  New    Target Date  06/28/17      PT SHORT TERM GOAL #2   Title  improve  Lt shoulder AROM by 10 degrees all motions for improved mobility    Status   New    Target Date  06/28/17      PT SHORT TERM GOAL #3   Title  report pain < 7/10 with activity for improved function    Status  New    Target Date  06/28/17        PT Long Term Goals - 05/31/17 1247      PT LONG TERM GOAL #1   Title  independent with advanced HEP    Status  New    Target Date  07/26/17      PT LONG TERM GOAL #2   Title  report pain < 4/10 with activity for improved tolerance and function    Status  New    Target Date  07/26/17      PT LONG TERM GOAL #3   Title  Lt shoulder ROM improved to [facility] for improved function and ADLs    Status  New    Target Date  07/26/17      PT LONG TERM GOAL #4   Title  FOTO score improved to </= 36% limited for improved function    Status  New    Target Date  07/26/17      PT LONG TERM GOAL #5   Title  demonstrate 4/5 Lt shoulder strength for improved function    Status  New    Target Date  07/26/17             Plan - 05/31/17 1233    Clinical Impression Statement  Pt is a 52 y/o female who presents to OPPT s/p Lt shoulder SAD and debridement on 05/15/17.  Pt demonstrates decreased ROM and strength as well as postural abnormalities affecting function and ADLs.  Pt will benefit from PT to address deficits listed.    History and Personal Factors relevant to plan of care:  Previous Rt wrist injury limiting ADLs, obesity    Clinical Presentation  Stable    Clinical Decision Making  Low    Rehab Potential  Good    PT Frequency  2x / week    PT Duration  8 weeks    PT Treatment/Interventions  ADLs/Self Care Home Management;Cryotherapy;Electrical Stimulation;Ultrasound;Moist Heat;Functional mobility training;Therapeutic activities;Therapeutic exercise;Patient/family education;Neuromuscular re-education;Passive range of motion;Manual techniques;Scar mobilization;Taping;Vasopneumatic Device    PT Next Visit Plan  review HEP given by MD's office (pt to bring), manual for ROM; AAROM, try isometrics; modalities PRN for pain     Consulted and Agree with Plan of Care  Patient       Patient will benefit from skilled therapeutic intervention in order to improve the following deficits and impairments:  Pain, Postural dysfunction, Decreased range of motion, Decreased strength, Impaired UE functional use  Visit Diagnosis: Acute pain of left shoulder - Plan: PT plan of care cert/re-cert  Stiffness of left shoulder, not elsewhere classified - Plan: PT plan of care cert/re-cert  Abnormal posture - Plan: PT plan of care cert/re-cert  Muscle weakness (generalized) - Plan: PT plan of care cert/re-cert     Problem List Patient Active Problem List   Diagnosis Date Noted  . Status post arthroscopy of left shoulder 05/15/2017  . Impingement syndrome of left shoulder 04/23/2017  . Right wrist pain 03/27/2013      Clarita Crane, PT, DPT 05/31/17 12:54 PM    [facility] Outpatient Rehabilitation [facility] [address] [place], [place], [address] Phone: [phone]  Fax:  [phone]  Name: ARAYLA KRUSCHKE MRN: [id number] Date of Birth: 17-Oct-1965

## 2017-06-04 ENCOUNTER — Ambulatory Visit: Payer: Medicare Other | Admitting: Physical Therapy

## 2017-06-04 ENCOUNTER — Encounter: Payer: Self-pay | Admitting: Physical Therapy

## 2017-06-04 DIAGNOSIS — M25612 Stiffness of left shoulder, not elsewhere classified: Secondary | ICD-10-CM

## 2017-06-04 DIAGNOSIS — M25512 Pain in left shoulder: Secondary | ICD-10-CM | POA: Diagnosis not present

## 2017-06-04 DIAGNOSIS — M6281 Muscle weakness (generalized): Secondary | ICD-10-CM

## 2017-06-04 DIAGNOSIS — R293 Abnormal posture: Secondary | ICD-10-CM

## 2017-06-04 NOTE — Therapy (Signed)
[facility] Outpatient Rehabilitation [facility] [address] [place], [place], [address] Phone: [phone]   Fax:  [phone]  Physical Therapy Treatment  Patient Details  Name: Emily Higgins MRN: [id number] Date of Birth: [birth year] Referring Provider: Dr. Doneen Poisson   Encounter Date: 06/04/2017  PT End of Session - 06/04/17 1526    Visit Number  2    Number of Visits  16    Date for PT Re-Evaluation  07/26/17    Authorization Type  Medicare    PT Start Time  1450    PT Stop Time  1540    PT Time Calculation (min)  50 min    Activity Tolerance  Patient tolerated treatment well;Patient limited by pain    Behavior During Therapy  [facility] for tasks assessed/performed       Past Medical History:  Diagnosis Date  . Impingement syndrome of left shoulder    severe  . Neuromuscular disorder (HCC)    numbness rt hand  . Obesity     Past Surgical History:  Procedure Laterality Date  . SHOULDER ARTHROSCOPY WITH SUBACROMIAL DECOMPRESSION Left 05/15/2017   Procedure: LEFT SHOULDER ARTHROSCOPY WITH EXTENSIVE DEBRIDEMENT AND SUBACROMIAL DECOMPRESSION;  Surgeon: Kathryne Hitch, MD;  [facility];  Service: Orthopedics;  Laterality: Left;  . TENDON REPAIR Right 11/13/2012   Procedure: RIGHT WRIST ECU(EXTENSOR CARPI ULNARIS) TENDON STABILIZATION ;  Surgeon: Sharma Covert, MD;  Location: [facility];  Service: Orthopedics;  Laterality: Right;  . TUBAL LIGATION    . 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 WISDOM TOOTH EXTRACTION    . WRIST ARTHRODESIS  3/14   tendon repair rt wrist  . WRIST FUSION Right 03/27/2013   Procedure: RIGHT WRIST DISTAL RADIOULNAR JOINT RECONSTRUCTION WITH POSSIBLE PINNNING AND TENDON GRAFTING;  Surgeon: Sharma Covert, MD;  [facility];  Service: Orthopedics;  Laterality: Right;    There were no vitals filed for this visit.  Subjective Assessment - 06/04/17 1453    Subjective  doing well today;  forgot HEP from doctor's office.      Patient Stated Goals  learn correct exercises to do at home; help regain strength and motion    Currently in Pain?  Yes    Pain Score  5     Pain Location  Shoulder    Pain Orientation  Left    Pain Descriptors / Indicators  Aching;Sharp    Pain Type  Surgical pain    Pain Onset  1 to 4 weeks ago    Pain Frequency  Intermittent    Aggravating Factors   movement, some exercises    Pain Relieving Factors  meds, ice, rest                      [facility] Adult PT Treatment/Exercise - 06/04/17 1454      Exercises   Exercises  Shoulder      Shoulder Exercises: Supine   External Rotation  AAROM;Left;10 reps cane; minimal motion    Flexion  Both;10 reps;AAROM cane; minimal motion due to pain    Other Supine Exercises  chest press with cane x 10      Shoulder Exercises: Pulleys   Flexion  3 minutes    ABduction  3 minutes    ABduction Limitations  scaption      Shoulder Exercises: Isometric Strengthening   Flexion  Supine;5X5"    Extension  Supine;5X5"  External Rotation  Supine;5X5"    Internal Rotation  Supine;5X5"    ABduction  Supine;5X5"      Modalities   Modalities  Vasopneumatic      Vasopneumatic   Number Minutes Vasopneumatic   15 minutes    Vasopnuematic Location   Shoulder    Vasopneumatic Pressure  Low    Vasopneumatic Temperature   40      Manual Therapy   Manual Therapy  Passive ROM;Joint mobilization    Joint Mobilization  gentle distraction Lt shoulder    Passive ROM  Lt shoulder flexion, abdct, er/ir to tolerance with end range holds; pt continues to have elevated pain limiting ROM               PT Short Term Goals - 05/31/17 1246      PT SHORT TERM GOAL #1   Title  independent with initial HEP    Status  New    Target Date  06/28/17      PT SHORT TERM GOAL #2   Title  improve Lt shoulder AROM by 10 degrees all motions for improved mobility    Status  New    Target Date  06/28/17      PT  SHORT TERM GOAL #3   Title  report pain < 7/10 with activity for improved function    Status  New    Target Date  06/28/17        PT Long Term Goals - 05/31/17 1247      PT LONG TERM GOAL #1   Title  independent with advanced HEP    Status  New    Target Date  07/26/17      PT LONG TERM GOAL #2   Title  report pain < 4/10 with activity for improved tolerance and function    Status  New    Target Date  07/26/17      PT LONG TERM GOAL #3   Title  Lt shoulder ROM improved to [facility] for improved function and ADLs    Status  New    Target Date  07/26/17      PT LONG TERM GOAL #4   Title  FOTO score improved to </= 36% limited for improved function    Status  New    Target Date  07/26/17      PT LONG TERM GOAL #5   Title  demonstrate 4/5 Lt shoulder strength for improved function    Status  New    Target Date  07/26/17            Plan - 06/04/17 1526    Clinical Impression Statement  Pain limiting ROM activities today, and discussed possiblility of premedicating prior to sessions to see if she is better able to tolerate PT.  Pt forgot HEP from doctor's office today so will plan to review at next session.  Will cotninue to benefit from PT to maximize function.    PT Treatment/Interventions  ADLs/Self Care Home Management;Cryotherapy;Electrical Stimulation;Ultrasound;Moist Heat;Functional mobility training;Therapeutic activities;Therapeutic exercise;Patient/family education;Neuromuscular re-education;Passive range of motion;Manual techniques;Scar mobilization;Taping;Vasopneumatic Device    PT Next Visit Plan  review HEP given by MD's office (pt to bring), manual for ROM; AAROM, cont isometrics; modalities PRN for pain    Consulted and Agree with Plan of Care  Patient       Patient will benefit from skilled therapeutic intervention in order to improve the following deficits and impairments:  Pain, Postural dysfunction, Decreased range of  motion, Decreased strength, Impaired UE  functional use  Visit Diagnosis: Acute pain of left shoulder  Stiffness of left shoulder, not elsewhere classified  Abnormal posture  Muscle weakness (generalized)     Problem List Patient Active Problem List   Diagnosis Date Noted  . Status post arthroscopy of left shoulder 05/15/2017  . Impingement syndrome of left shoulder 04/23/2017  . Right wrist pain 03/27/2013      Clarita CraneStephanie F Rithwik Schmieg, PT, DPT 06/04/17 3:28 PM    [facility] [address] [place], [place], [address] Phone: [phone]   Fax:  [phone]  Name: Emily Higgins MRN: [id number] Date of Birth: May 20, 1965

## 2017-06-12 ENCOUNTER — Encounter: Payer: Self-pay | Admitting: Physical Therapy

## 2017-06-12 ENCOUNTER — Ambulatory Visit: Payer: Medicare Other | Admitting: Physical Therapy

## 2017-06-12 DIAGNOSIS — M25512 Pain in left shoulder: Secondary | ICD-10-CM

## 2017-06-12 DIAGNOSIS — M25612 Stiffness of left shoulder, not elsewhere classified: Secondary | ICD-10-CM

## 2017-06-12 DIAGNOSIS — M6281 Muscle weakness (generalized): Secondary | ICD-10-CM

## 2017-06-12 DIAGNOSIS — R293 Abnormal posture: Secondary | ICD-10-CM

## 2017-06-12 NOTE — Therapy (Signed)
[facility] [address] [place], [place], [address] Phone: [phone]   Fax:  [phone]  Physical Therapy Treatment  Patient Details  Name: Emily Higgins MRN: [id number] Date of Birth: 03-06-1966 Referring Provider: Dr. Doneen Poissonhristopher Blackman   Encounter Date: 06/12/2017  PT End of Session - 06/12/17 1141    Visit Number  3    Number of Visits  16    Date for PT Re-Evaluation  07/26/17    PT Start Time  0933    PT Stop Time  1026    PT Time Calculation (min)  53 min    Activity Tolerance  Patient tolerated treatment well;Patient limited by pain    Behavior During Therapy  [facility] for tasks assessed/performed       Past Medical History:  Diagnosis Date  . Impingement syndrome of left shoulder    severe  . Neuromuscular disorder (HCC)    numbness rt hand  . Obesity     Past Surgical History:  Procedure Laterality Date  . SHOULDER ARTHROSCOPY WITH SUBACROMIAL DECOMPRESSION Left 05/15/2017   Procedure: LEFT SHOULDER ARTHROSCOPY WITH EXTENSIVE DEBRIDEMENT AND SUBACROMIAL DECOMPRESSION;  Surgeon: Kathryne HitchBlackman, Christopher Y, MD;  [facility];  Service: Orthopedics;  Laterality: Left;  . TENDON REPAIR Right 11/13/2012   Procedure: RIGHT WRIST ECU(EXTENSOR CARPI ULNARIS) TENDON STABILIZATION ;  Surgeon: Sharma CovertFred W Ortmann, MD;  Location: [facility];  Service: Orthopedics;  Laterality: Right;  . TUBAL LIGATION    . 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 WISDOM TOOTH EXTRACTION    . WRIST ARTHRODESIS  3/14   tendon repair rt wrist  . WRIST FUSION Right 03/27/2013   Procedure: RIGHT WRIST DISTAL RADIOULNAR JOINT RECONSTRUCTION WITH POSSIBLE PINNNING AND TENDON GRAFTING;  Surgeon: Sharma CovertFred W Ortmann, MD;  [facility];  Service: Orthopedics;  Laterality: Right;    There were no vitals filed for this visit.  Subjective Assessment - 06/12/17 0947    Subjective  MD on 27th.  exercises 10 minutes a day.    Currently  in Pain?  Yes    Pain Score  5     Pain Location  Shoulder    Pain Orientation  Left    Pain Descriptors / Indicators  Aching;Sharp    Pain Type  Surgical pain    Pain Frequency  Intermittent    Aggravating Factors   movement,  exrcises  after exercise    Pain Relieving Factors  rest,  meds warm shower  heating pad    Effect of Pain on Daily Activities  needs assist with dressing,  Not back to all ADLNo heavy lifting    Multiple Pain Sites  -- back cramp sometimes bending                      [facility] Adult PT Treatment/Exercise - 06/12/17 0001      Shoulder Exercises: Supine   Flexion  5 reps cane difficult    Other Supine Exercises  chest press 10 x partial movement cane      Shoulder Exercises: Prone   Extension  -- leaning over counter with arm hang 10 X, cued,  also row 10X      Shoulder Exercises: Standing   Flexion  5 reps finger walking on wall,  painful guarding    Row  10 reps yellow band  HEP after cued,  practice    Other Standing Exercises  circles both ways  to limber up      Shoulder Exercises: Pulleys   Flexion  3 minutes small motions    ABduction  3 minutes      Shoulder Exercises: Isometric Strengthening   Flexion  5X5"    Extension  5X5" all sitting    External Rotation  5X5"    Internal Rotation  5X5"    ABduction  5X5"      Shoulder Exercises: Stretch   Table Stretch - Flexion  -- 10 X    Table Stretch - Abduction  -- scaption 10 X      Cryotherapy   Number Minutes Cryotherapy  10 Minutes    Cryotherapy Location  Shoulder    Type of Cryotherapy  -- cold pack      Manual Therapy   Manual Therapy  Passive ROM    Joint Mobilization  posterior glides, did not tolerate gentle distraction today    Passive ROM  guarding,  was able to get about 60 degrees ER in supine             PT Education - 06/12/17 1140    Education provided  Yes    Education Details  HEP    Person(s) Educated  Patient    Methods   Explanation;Demonstration;Tactile cues;Verbal cues;Handout    Comprehension  Verbalized understanding;Returned demonstration       PT Short Term Goals - 05/31/17 1246      PT SHORT TERM GOAL #1   Title  independent with initial HEP    Status  New    Target Date  06/28/17      PT SHORT TERM GOAL #2   Title  improve Lt shoulder AROM by 10 degrees all motions for improved mobility    Status  New    Target Date  06/28/17      PT SHORT TERM GOAL #3   Title  report pain < 7/10 with activity for improved function    Status  New    Target Date  06/28/17        PT Long Term Goals - 05/31/17 1247      PT LONG TERM GOAL #1   Title  independent with advanced HEP    Status  New    Target Date  07/26/17      PT LONG TERM GOAL #2   Title  report pain < 4/10 with activity for improved tolerance and function    Status  New    Target Date  07/26/17      PT LONG TERM GOAL #3   Title  Lt shoulder ROM improved to [facility] for improved function and ADLs    Status  New    Target Date  07/26/17      PT LONG TERM GOAL #4   Title  FOTO score improved to </= 36% limited for improved function    Status  New    Target Date  07/26/17      PT LONG TERM GOAL #5   Title  demonstrate 4/5 Lt shoulder strength for improved function    Status  New    Target Date  07/26/17            Plan - 06/12/17 1141    Clinical Impression Statement  Progressed her HEP today.  She has Pain and guarding with exercises.  ER about 60 degrees PROM.  patient not yet getting 90  degrees PROM flexion.  Pain 7-8/10 at end of exercise at  end of session prior to cold pack. She is onlt exercising 10 minutes a day.    PT Next Visit Plan  review HEP given by MD's office (pt to bring) ( wall walk,  standing abduction with broom IR with towel stretchPendulum exercise), manual for ROM; AAROM, cont isometrics; modalities PRN for pain Consider IFC with exercise.   PT Home Exercise Plan  Row, yellow    Consulted and Agree with  Plan of Care  Patient       Patient will benefit from skilled therapeutic intervention in order to improve the following deficits and impairments:     Visit Diagnosis: Acute pain of left shoulder  Stiffness of left shoulder, not elsewhere classified  Abnormal posture  Muscle weakness (generalized)     Problem List Patient Active Problem List   Diagnosis Date Noted  . Status post arthroscopy of left shoulder 05/15/2017  . Impingement syndrome of left shoulder 04/23/2017  . Right wrist pain 03/27/2013    HARRIS,KAREN PTA 06/12/2017, 11:45 AM  [facility] [address] [place], [place], [address] Phone: [phone]   Fax:  [phone]  Name: Emily Higgins MRN: [id number] Date of Birth: April 19, 1966

## 2017-06-12 NOTE — Patient Instructions (Signed)
Resistive Band Rowing    With resistive band anchored in door, grasp both ends. Keeping elbows bent, pull back, squeezing shoulder blades together. Hold __1__ seconds. Repeat ___5-20_ times. Do 2____ sessions per day.  http://gt2.exer.us/98   Copyright  VHI. All rights reserved.

## 2017-06-14 ENCOUNTER — Encounter: Payer: Self-pay | Admitting: Physical Therapy

## 2017-06-14 ENCOUNTER — Ambulatory Visit: Payer: Medicare Other | Admitting: Physical Therapy

## 2017-06-14 DIAGNOSIS — M6281 Muscle weakness (generalized): Secondary | ICD-10-CM

## 2017-06-14 DIAGNOSIS — M25612 Stiffness of left shoulder, not elsewhere classified: Secondary | ICD-10-CM

## 2017-06-14 DIAGNOSIS — R293 Abnormal posture: Secondary | ICD-10-CM

## 2017-06-14 DIAGNOSIS — M25512 Pain in left shoulder: Secondary | ICD-10-CM | POA: Diagnosis not present

## 2017-06-14 NOTE — Therapy (Signed)
[facility] [address] [place], [place], [address] Phone: [phone]   Fax:  [phone]  Physical Therapy Treatment  Patient Details  Name: Emily Higgins MRN: [id number] Date of Birth: 08/14/1965 Referring Provider: Dr. Doneen Poisson   Encounter Date: 06/14/2017  PT End of Session - 06/14/17 1007    Visit Number  4    Number of Visits  16    Date for PT Re-Evaluation  07/26/17    Authorization Type  Medicare    PT Start Time  0928    PT Stop Time  1016    PT Time Calculation (min)  48 min    Activity Tolerance  Patient tolerated treatment well;Patient limited by pain    Behavior During Therapy  [facility] for tasks assessed/performed       Past Medical History:  Diagnosis Date  . Impingement syndrome of left shoulder    severe  . Neuromuscular disorder (HCC)    numbness rt hand  . Obesity     Past Surgical History:  Procedure Laterality Date  . SHOULDER ARTHROSCOPY WITH SUBACROMIAL DECOMPRESSION Left 05/15/2017   Procedure: LEFT SHOULDER ARTHROSCOPY WITH EXTENSIVE DEBRIDEMENT AND SUBACROMIAL DECOMPRESSION;  Surgeon: Kathryne Hitch, MD;  Location: [facility];  Service: Orthopedics;  Laterality: Left;  . TENDON REPAIR Right 11/13/2012   Procedure: RIGHT WRIST ECU(EXTENSOR CARPI ULNARIS) TENDON STABILIZATION ;  Surgeon: Sharma Covert, MD;  Location: [facility];  Service: Orthopedics;  Laterality: Right;  . TUBAL LIGATION    . 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 WISDOM TOOTH EXTRACTION    . WRIST ARTHRODESIS  3/14   tendon repair rt wrist  . WRIST FUSION Right 03/27/2013   Procedure: RIGHT WRIST DISTAL RADIOULNAR JOINT RECONSTRUCTION WITH POSSIBLE PINNNING AND TENDON GRAFTING;  Surgeon: Sharma Covert, MD;  Location: [facility];  Service: Orthopedics;  Laterality: Right;    There were no vitals filed for this visit.  Subjective Assessment - 06/14/17 0933    Subjective  feels tight today;  still having soreness and tenderness    Patient Stated Goals  learn correct exercises to do at home; help regain strength and motion    Currently in Pain?  Yes    Pain Score  6     Pain Location  Shoulder    Pain Orientation  Left    Pain Descriptors / Indicators  Aching;Sharp    Pain Type  Surgical pain    Pain Onset  1 to 4 weeks ago    Pain Frequency  Intermittent    Aggravating Factors   movement, exercises    Pain Relieving Factors  rest, meds, warm shower, heating pad                      [facility] Adult PT Treatment/Exercise - 06/14/17 0934      Shoulder Exercises: Standing   External Rotation  Right;15 reps;Theraband    Theraband Level (Shoulder External Rotation)  Level 1 (Yellow)    Internal Rotation  Right;15 reps;Theraband    Theraband Level (Shoulder Internal Rotation)  Level 1 (Yellow)    Row  15 reps;Both;Theraband    Theraband Level (Shoulder Row)  Level 1 (Yellow)      Shoulder Exercises: Pulleys   Flexion  3 minutes    ABduction  3 minutes      Cryotherapy   Number Minutes Cryotherapy  10 Minutes    Cryotherapy Location  Shoulder    Type of Cryotherapy  Ice pack      Manual Therapy   Manual Therapy  Joint mobilization;Passive ROM    Joint Mobilization  inf and A/p Lt shoulder; grade 1    Passive ROM  AAROM more effective; Lt shoulder all motions               PT Short Term Goals - 05/31/17 1246      PT SHORT TERM GOAL #1   Title  independent with initial HEP    Status  New    Target Date  06/28/17      PT SHORT TERM GOAL #2   Title  improve Lt shoulder AROM by 10 degrees all motions for improved mobility    Status  New    Target Date  06/28/17      PT SHORT TERM GOAL #3   Title  report pain < 7/10 with activity for improved function    Status  New    Target Date  06/28/17        PT Long Term Goals - 05/31/17 1247      PT LONG TERM GOAL #1   Title  independent with advanced HEP    Status  New    Target Date  07/26/17       PT LONG TERM GOAL #2   Title  report pain < 4/10 with activity for improved tolerance and function    Status  New    Target Date  07/26/17      PT LONG TERM GOAL #3   Title  Lt shoulder ROM improved to [facility] for improved function and ADLs    Status  New    Target Date  07/26/17      PT LONG TERM GOAL #4   Title  FOTO score improved to </= 36% limited for improved function    Status  New    Target Date  07/26/17      PT LONG TERM GOAL #5   Title  demonstrate 4/5 Lt shoulder strength for improved function    Status  New    Target Date  07/26/17            Plan - 06/14/17 1008    Clinical Impression Statement  Pt is limited by pain, but overall motivated and with slow improvements in ROM noted.  PROM flexion ~ 95-100 today.  Slowly progressing towards goals.    PT Treatment/Interventions  ADLs/Self Care Home Management;Cryotherapy;Electrical Stimulation;Ultrasound;Moist Heat;Functional mobility training;Therapeutic activities;Therapeutic exercise;Patient/family education;Neuromuscular re-education;Passive range of motion;Manual techniques;Scar mobilization;Taping;Vasopneumatic Device    PT Next Visit Plan  review HEP given by MD's office (pt to bring) ( wall walk,  standing abduction with broom IR with towel stretchPendulum exercise), manual for ROM; AAROM, cont isometrics; modalities PRN for pain    Consulted and Agree with Plan of Care  Patient       Patient will benefit from skilled therapeutic intervention in order to improve the following deficits and impairments:  Pain, Postural dysfunction, Decreased range of motion, Decreased strength, Impaired UE functional use  Visit Diagnosis: Acute pain of left shoulder  Stiffness of left shoulder, not elsewhere classified  Abnormal posture  Muscle weakness (generalized)     Problem List Patient Active Problem List   Diagnosis Date Noted  . Status post arthroscopy of left shoulder 05/15/2017  . Impingement syndrome  of left shoulder 04/23/2017  . Right wrist pain 03/27/2013      Judeth Cornfield  Maudry MayhewF Julias Mould, PT, DPT 06/14/17 10:09 AM    [facility] [address] [place], [place], [address] Phone: [phone]   Fax:  [phone]  Name: Emily Higgins MRN: [id number] Date of Birth: June 22, 1965

## 2017-06-19 ENCOUNTER — Ambulatory Visit: Payer: Medicare Other | Admitting: Physical Therapy

## 2017-06-19 ENCOUNTER — Encounter: Payer: Self-pay | Admitting: Physical Therapy

## 2017-06-19 DIAGNOSIS — R293 Abnormal posture: Secondary | ICD-10-CM

## 2017-06-19 DIAGNOSIS — M6281 Muscle weakness (generalized): Secondary | ICD-10-CM

## 2017-06-19 DIAGNOSIS — M25612 Stiffness of left shoulder, not elsewhere classified: Secondary | ICD-10-CM

## 2017-06-19 DIAGNOSIS — M25512 Pain in left shoulder: Secondary | ICD-10-CM | POA: Diagnosis not present

## 2017-06-19 NOTE — Patient Instructions (Signed)
SHOULDER: Flexion - Supine    Raise left arm overhead with palm up. _10__ reps per set, __2-3_ sets per day.   External Rotation (Eccentric), Active-Assist - Side-Lying    Lie on side, affected arm on top, elbow bent to 90, towel under elbow. Use other arm to lift forearm of affected arm. _10__ reps per set, __2-3_ sets per day.   Abduction (Side-Lying)    Lie on right side. Raise arm above head. Keep palm forward. Repeat __10__ times per set. Do __1__ sets per session. Do __2-3__ sessions per day.

## 2017-06-19 NOTE — Therapy (Signed)
[facility] [address] [place], [place], [address] Phone: [phone]   Fax:  [phone]  Physical Therapy Treatment  Patient Details  Name: Emily Higgins MRN: [id number] Date of Birth: 03-May-1965 Referring Provider: Dr. Doneen Poissonhristopher Blackman   Encounter Date: 06/19/2017  PT End of Session - 06/19/17 1010    Visit Number  5    Number of Visits  16    Date for PT Re-Evaluation  07/26/17    Authorization Type  Medicare    PT Start Time  0929    PT Stop Time  1019    PT Time Calculation (min)  50 min    Activity Tolerance  Patient tolerated treatment well    Behavior During Therapy  [facility] for tasks assessed/performed       Past Medical History:  Diagnosis Date  . Impingement syndrome of left shoulder    severe  . Neuromuscular disorder (HCC)    numbness rt hand  . Obesity     Past Surgical History:  Procedure Laterality Date  . SHOULDER ARTHROSCOPY WITH SUBACROMIAL DECOMPRESSION Left 05/15/2017   Procedure: LEFT SHOULDER ARTHROSCOPY WITH EXTENSIVE DEBRIDEMENT AND SUBACROMIAL DECOMPRESSION;  Surgeon: Kathryne HitchBlackman, Christopher Y, MD;  [facility];  Service: Orthopedics;  Laterality: Left;  . TENDON REPAIR Right 11/13/2012   Procedure: RIGHT WRIST ECU(EXTENSOR CARPI ULNARIS) TENDON STABILIZATION ;  Surgeon: Sharma CovertFred W Ortmann, MD;  Location: [facility];  Service: Orthopedics;  Laterality: Right;  . TUBAL LIGATION    . 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 WISDOM TOOTH EXTRACTION    . WRIST ARTHRODESIS  3/14   tendon repair rt wrist  . WRIST FUSION Right 03/27/2013   Procedure: RIGHT WRIST DISTAL RADIOULNAR JOINT RECONSTRUCTION WITH POSSIBLE PINNNING AND TENDON GRAFTING;  Surgeon: Sharma CovertFred W Ortmann, MD;  Location: [facility];  Service: Orthopedics;  Laterality: Right;    There were no vitals filed for this visit.  Subjective Assessment - 06/19/17 0929    Subjective  sees Dr. Magnus IvanBlackman tomorrow; shoulder still  sore and stiff but feels motion is increasing    Patient Stated Goals  learn correct exercises to do at home; help regain strength and motion    Currently in Pain?  Yes    Pain Score  4     Pain Location  Shoulder    Pain Orientation  Left    Pain Descriptors / Indicators  Aching;Sharp    Pain Type  Surgical pain    Pain Onset  1 to 4 weeks ago    Pain Frequency  Intermittent    Aggravating Factors   movement, exercises    Pain Relieving Factors  rest, meds, warm shower, heating pad         [facility] PT Assessment - 06/19/17 0936      Assessment   Medical Diagnosis  Lt shoulder scope with SAD and debridement    Referring Provider  Dr. Doneen Poissonhristopher Blackman    Onset Date/Surgical Date  05/15/17    Next MD Visit  06/20/17      AROM   Left Shoulder Flexion  136 Degrees after manual and AAROM    Left Shoulder ABduction  93 Degrees    Left Shoulder Internal Rotation  83 Degrees    Left Shoulder External Rotation  65 Degrees                  [facility] Adult PT Treatment/Exercise - 06/19/17 [id number]  Shoulder Exercises: Supine   Flexion  Left;10 reps;AROM      Shoulder Exercises: Sidelying   External Rotation  AROM;Left;10 reps    ABduction  AROM;Left;10 reps scapular plane      Shoulder Exercises: Pulleys   Flexion  3 minutes    ABduction  3 minutes    ABduction Limitations  scaption      Cryotherapy   Number Minutes Cryotherapy  10 Minutes    Cryotherapy Location  Shoulder    Type of Cryotherapy  Ice pack             PT Education - 06/19/17 1009    Education provided  Yes    Education Details  AROM HEP    Person(s) Educated  Patient    Methods  Explanation;Demonstration;Handout    Comprehension  Verbalized understanding;Returned demonstration       PT Short Term Goals - 06/19/17 1010      PT SHORT TERM GOAL #1   Title  independent with initial HEP    Target Date  06/28/17      PT SHORT TERM GOAL #2   Title  improve Lt shoulder AROM by 10 degrees  all motions for improved mobility    Status  Achieved      PT SHORT TERM GOAL #3   Title  report pain < 7/10 with activity for improved function    Status  On-going        PT Long Term Goals - 05/31/17 1247      PT LONG TERM GOAL #1   Title  independent with advanced HEP    Status  New    Target Date  07/26/17      PT LONG TERM GOAL #2   Title  report pain < 4/10 with activity for improved tolerance and function    Status  New    Target Date  07/26/17      PT LONG TERM GOAL #3   Title  Lt shoulder ROM improved to [facility] for improved function and ADLs    Status  New    Target Date  07/26/17      PT LONG TERM GOAL #4   Title  FOTO score improved to </= 36% limited for improved function    Status  New    Target Date  07/26/17      PT LONG TERM GOAL #5   Title  demonstrate 4/5 Lt shoulder strength for improved function    Status  New    Target Date  07/26/17            Plan - 06/19/17 1010    Clinical Impression Statement  Pt with improved ROM meeting STG #2 at this time.  Initiated AROM HEP to work on ROM and strengthening.  Slowly progressing with PT.    PT Treatment/Interventions  ADLs/Self Care Home Management;Cryotherapy;Electrical Stimulation;Ultrasound;Moist Heat;Functional mobility training;Therapeutic activities;Therapeutic exercise;Patient/family education;Neuromuscular re-education;Passive range of motion;Manual techniques;Scar mobilization;Taping;Vasopneumatic Device    PT Next Visit Plan  see what MD says, manual for ROM; AAROM, cont isometrics; modalities PRN for pain    Consulted and Agree with Plan of Care  Patient       Patient will benefit from skilled therapeutic intervention in order to improve the following deficits and impairments:  Pain, Postural dysfunction, Decreased range of motion, Decreased strength, Impaired UE functional use  Visit Diagnosis: Acute pain of left shoulder  Stiffness of left shoulder, not elsewhere classified  Abnormal  posture  Muscle  weakness (generalized)     Problem List Patient Active Problem List   Diagnosis Date Noted  . Status post arthroscopy of left shoulder 05/15/2017  . Impingement syndrome of left shoulder 04/23/2017  . Right wrist pain 03/27/2013      Clarita Crane, PT, DPT 06/19/17 10:12 AM    [facility] [address] [place], [place], [address] Phone: [phone]   Fax:  [phone]  Name: Emily Higgins MRN: [id number] Date of Birth: [birth year]

## 2017-06-20 ENCOUNTER — Ambulatory Visit (INDEPENDENT_AMBULATORY_CARE_PROVIDER_SITE_OTHER): Payer: Medicare Other | Admitting: Orthopaedic Surgery

## 2017-06-20 ENCOUNTER — Encounter (INDEPENDENT_AMBULATORY_CARE_PROVIDER_SITE_OTHER): Payer: Self-pay | Admitting: Orthopaedic Surgery

## 2017-06-20 DIAGNOSIS — Z9889 Other specified postprocedural states: Secondary | ICD-10-CM

## 2017-06-20 NOTE — Progress Notes (Signed)
HPI: Ms. Emily Higgins returns now 36 days status post left shoulder arthroscopy with extensive debridement and CAD.  She is overall trending towards improvement.  She is working with physical therapy feels her range of motion strength improving.  She still sleeping in a recliner.  She states her pain has diminished since surgery though.    Physical exam General well-developed well-nourished female no acute distress.  Left shoulder abduction 90 degrees forward flexion to approximately 140.  She has 5 out of 5 strength with external and internal rotation against resistance.  Positive impingement pain.  Impression: Status post left shoulder arthroscopy with extensive debridement NSAID 05/15/2017  Plan: Discussed with her taking Aleve with some occasional Tylenol.  She also use muscle relaxer after therapy but not drive on bedtime he is Flexeril she has.  She will follow-up with us in 1 month check her progress or lack.  She will continue physical therapy.

## 2017-06-21 ENCOUNTER — Encounter: Payer: Self-pay | Admitting: Physical Therapy

## 2017-06-21 ENCOUNTER — Ambulatory Visit: Payer: Medicare Other | Admitting: Physical Therapy

## 2017-06-21 DIAGNOSIS — M25612 Stiffness of left shoulder, not elsewhere classified: Secondary | ICD-10-CM

## 2017-06-21 DIAGNOSIS — M25512 Pain in left shoulder: Secondary | ICD-10-CM | POA: Diagnosis not present

## 2017-06-21 DIAGNOSIS — R293 Abnormal posture: Secondary | ICD-10-CM

## 2017-06-21 DIAGNOSIS — M6281 Muscle weakness (generalized): Secondary | ICD-10-CM

## 2017-06-21 NOTE — Therapy (Signed)
[facility] [address] [place], [place], [address] Phone: [phone]   Fax:  [phone]  Physical Therapy Treatment  Patient Details  Name: Emily Higgins MRN: [id number] Date of Birth: [birth year] Referring Provider: Dr. Doneen Poissonhristopher Blackman   Encounter Date: 06/21/2017  PT End of Session - 06/21/17 1010    Visit Number  6    Number of Visits  16    Date for PT Re-Evaluation  07/26/17    Authorization Type  Medicare    PT Start Time  0930    PT Stop Time  1020    PT Time Calculation (min)  50 min    Activity Tolerance  Patient tolerated treatment well    Behavior During Therapy  [facility] for tasks assessed/performed       Past Medical History:  Diagnosis Date  . Impingement syndrome of left shoulder    severe  . Neuromuscular disorder (HCC)    numbness rt hand  . Obesity     Past Surgical History:  Procedure Laterality Date  . SHOULDER ARTHROSCOPY WITH SUBACROMIAL DECOMPRESSION Left 05/15/2017   Procedure: LEFT SHOULDER ARTHROSCOPY WITH EXTENSIVE DEBRIDEMENT AND SUBACROMIAL DECOMPRESSION;  Surgeon: Kathryne HitchBlackman, Christopher Y, MD;  [facility];  Service: Orthopedics;  Laterality: Left;  . TENDON REPAIR Right 11/13/2012   Procedure: RIGHT WRIST ECU(EXTENSOR CARPI ULNARIS) TENDON STABILIZATION ;  Surgeon: Sharma CovertFred W Ortmann, MD;  Location: [facility];  Service: Orthopedics;  Laterality: Right;  . TUBAL LIGATION    . 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 WISDOM TOOTH EXTRACTION    . WRIST ARTHRODESIS  3/14   tendon repair rt wrist  . WRIST FUSION Right 03/27/2013   Procedure: RIGHT WRIST DISTAL RADIOULNAR JOINT RECONSTRUCTION WITH POSSIBLE PINNNING AND TENDON GRAFTING;  Surgeon: Sharma CovertFred W Ortmann, MD;  Location: [facility];  Service: Orthopedics;  Laterality: Right;    There were no vitals filed for this visit.  Subjective Assessment - 06/21/17 0929    Subjective  doing well; saw PA yesterday.  said she's  doing well and making progress.      Patient Stated Goals  learn correct exercises to do at home; help regain strength and motion    Currently in Pain?  No/denies                      [facility]/Ex[facility] - 06/21/17 0938      Shoulder Exercises: Supine   Flexion  Left;10 reps;AROM      Shoulder Exercises: Standing   External Rotation  Left;20 reps;Theraband    Theraband Level (Shoulder External Rotation)  Level 1 (Yellow)    Internal Rotation  Left;20 reps;Theraband    Theraband Level (Shoulder Internal Rotation)  Level 1 (Yellow)    Flexion  Left;10 reps wall ladder; used RUE to help with eccentric control    Row  Both;20 reps;Theraband    Theraband Level (Shoulder Row)  Level 1 (Yellow)      Shoulder Exercises: Pulleys   Flexion  3 minutes    ABduction  3 minutes    ABduction Limitations  scaption      Cryotherapy   Number Minutes Cryotherapy  10 Minutes    Cryotherapy Location  Shoulder    Type of Cryotherapy  Ice pack      Manual Therapy   Manual Therapy  Passive ROM    Passive ROM  AAROM more effective; Lt shoulder all motions  PT Short Term Goals - 06/19/17 1010      PT SHORT TERM GOAL #1   Title  independent with initial HEP    Target Date  06/28/17      PT SHORT TERM GOAL #2   Title  improve Lt shoulder AROM by 10 degrees all motions for improved mobility    Status  Achieved      PT SHORT TERM GOAL #3   Title  report pain < 7/10 with activity for improved function    Status  On-going        PT Long Term Goals - 05/31/17 1247      PT LONG TERM GOAL #1   Title  independent with advanced HEP    Status  New    Target Date  07/26/17      PT LONG TERM GOAL #2   Title  report pain < 4/10 with activity for improved tolerance and function    Status  New    Target Date  07/26/17      PT LONG TERM GOAL #3   Title  Lt shoulder ROM improved to [facility] for improved function and ADLs    Status  New    Target Date   07/26/17      PT LONG TERM GOAL #4   Title  FOTO score improved to </= 36% limited for improved function    Status  New    Target Date  07/26/17      PT LONG TERM GOAL #5   Title  demonstrate 4/5 Lt shoulder strength for improved function    Status  New    Target Date  07/26/17            Plan - 06/21/17 1011    Clinical Impression Statement  Pt tolerated session well today and reports pain improving overall.  Continues to demonstrate improvements in ROM and able to perform gentle strengthening exercises.    PT Next Visit Plan  manual for ROM; AAROM, gentle strengthening exercises    Consulted and Agree with Plan of Care  Patient       Patient will benefit from skilled therapeutic intervention in order to improve the following deficits and impairments:  Pain, Postural dysfunction, Decreased range of motion, Decreased strength, Impaired UE functional use  Visit Diagnosis: Acute pain of left shoulder  Stiffness of left shoulder, not elsewhere classified  Abnormal posture  Muscle weakness (generalized)     Problem List Patient Active Problem List   Diagnosis Date Noted  . Status post arthroscopy of left shoulder 05/15/2017  . Impingement syndrome of left shoulder 04/23/2017  . Right wrist pain 03/27/2013      Clarita Crane, PT, DPT 06/21/17 10:13 AM    [facility] [address] [place], [place], [address] Phone: [phone]   Fax:  [phone]  Name: Emily Higgins MRN: [id number] Date of Birth: Apr 21, 1966

## 2017-06-26 ENCOUNTER — Encounter: Payer: Self-pay | Admitting: Physical Therapy

## 2017-06-26 ENCOUNTER — Ambulatory Visit: Payer: Medicare Other | Attending: Orthopaedic Surgery | Admitting: Physical Therapy

## 2017-06-26 DIAGNOSIS — M25512 Pain in left shoulder: Secondary | ICD-10-CM | POA: Diagnosis present

## 2017-06-26 DIAGNOSIS — M25612 Stiffness of left shoulder, not elsewhere classified: Secondary | ICD-10-CM | POA: Insufficient documentation

## 2017-06-26 DIAGNOSIS — R293 Abnormal posture: Secondary | ICD-10-CM | POA: Diagnosis present

## 2017-06-26 DIAGNOSIS — M6281 Muscle weakness (generalized): Secondary | ICD-10-CM

## 2017-06-26 NOTE — Therapy (Signed)
[facility] [address] [place], [place], [address] Phone: [phone]   Fax:  [phone]  Physical Therapy Treatment  Patient Details  Name: Emily Higgins MRN: [id number] Date of Birth: 10-22-1965 Referring Provider: Dr. Doneen Poissonhristopher Blackman   Encounter Date: 06/26/2017  PT End of Session - 06/26/17 0935    Visit Number  7    Number of Visits  16    Date for PT Re-Evaluation  07/26/17    Authorization Type  Medicare    PT Start Time  0930    PT Stop Time  1023    PT Time Calculation (min)  53 min       Past Medical History:  Diagnosis Date  . Impingement syndrome of left shoulder    severe  . Neuromuscular disorder (HCC)    numbness rt hand  . Obesity     Past Surgical History:  Procedure Laterality Date  . SHOULDER ARTHROSCOPY WITH SUBACROMIAL DECOMPRESSION Left 05/15/2017   Procedure: LEFT SHOULDER ARTHROSCOPY WITH EXTENSIVE DEBRIDEMENT AND SUBACROMIAL DECOMPRESSION;  Surgeon: Kathryne HitchBlackman, Christopher Y, MD;  Location[facility];  Service: Orthopedics;  Laterality: Left;  . TENDON REPAIR Right 11/13/2012   Procedure: RIGHT WRIST ECU(EXTENSOR CARPI ULNARIS) TENDON STABILIZATION ;  Surgeon: Sharma CovertFred W Ortmann, MD;  Location: [facility];  Service: Orthopedics;  Laterality: Right;  . TUBAL LIGATION    . 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 WISDOM TOOTH EXTRACTION    . WRIST ARTHRODESIS  3/14   tendon repair rt wrist  . WRIST FUSION Right 03/27/2013   Procedure: RIGHT WRIST DISTAL RADIOULNAR JOINT RECONSTRUCTION WITH POSSIBLE PINNNING AND TENDON GRAFTING;  Surgeon: Sharma CovertFred W Ortmann, MD;  Location: [facility];  Service: Orthopedics;  Laterality: Right;    There were no vitals filed for this visit.  Subjective Assessment - 06/26/17 0934    Subjective  I have not done my exercises much. I have not been felling well in my body.     Currently in Pain?  No/denies    Aggravating Factors   late at night, lifting pots,  laundry     Pain Relieving Factors  rest, meds, heating pad                       [facility] Adult PT Treatment/Exercise - 06/26/17 0001      Shoulder Exercises: Standing   External Rotation  15 reps    Theraband Level (Shoulder External Rotation)  Level 2 (Red)    Internal Rotation  15 reps    Theraband Level (Shoulder Internal Rotation)  Level 2 (Red)    Flexion  Left;10 reps wall ladder; able to slide down without assist     Extension  10 reps;Theraband thumbs up for wrist comfort    Theraband Level (Shoulder Extension)  Level 2 (Red)    Row  15 reps    Theraband Level (Shoulder Row)  Level 2 (Red)    Other Standing Exercises  standing cane flexion and abduction x 10 each , reach behind back x 5 reaches to left lower lumbar - increased pain. Pendulums following to decrease pain.       Shoulder Exercises: Pulleys   Flexion  3 minutes      Cryotherapy   Number Minutes Cryotherapy  10 Minutes    Cryotherapy Location  Shoulder    Type of Cryotherapy  Ice pack      Manual Therapy   Passive  ROM  passive flexion-pt c/o "kink in shoulder" needed to sit up                PT Short Term Goals - 06/19/17 1010      PT SHORT TERM GOAL #1   Title  independent with initial HEP    Target Date  06/28/17      PT SHORT TERM GOAL #2   Title  improve Lt shoulder AROM by 10 degrees all motions for improved mobility    Status  Achieved      PT SHORT TERM GOAL #3   Title  report pain < 7/10 with activity for improved function    Status  On-going        PT Long Term Goals - 05/31/17 1247      PT LONG TERM GOAL #1   Title  independent with advanced HEP    Status  New    Target Date  07/26/17      PT LONG TERM GOAL #2   Title  report pain < 4/10 with activity for improved tolerance and function    Status  New    Target Date  07/26/17      PT LONG TERM GOAL #3   Title  Lt shoulder ROM improved to [facility] for improved function and ADLs    Status  New    Target Date   07/26/17      PT LONG TERM GOAL #4   Title  FOTO score improved to </= 36% limited for improved function    Status  New    Target Date  07/26/17      PT LONG TERM GOAL #5   Title  demonstrate 4/5 Lt shoulder strength for improved function    Status  New    Target Date  07/26/17            Plan - 06/26/17 0936    Clinical Impression Statement  Pt reports after PT pain is 9/10 and also after house work can be 9/10. Advanced to red band today. She was able to use wall ladder without UE assist today. Began standing cane AAROM with good tolerance except with IR, increased pain. Pendulums releived pain. Ice pack post treatment.     PT Next Visit Plan  FOTO; manual for ROM; AAROM, gentle strengthening exercises    PT Home Exercise Plan  Row, yellow    Consulted and Agree with Plan of Care  Patient       Patient will benefit from skilled therapeutic intervention in order to improve the following deficits and impairments:  Pain, Postural dysfunction, Decreased range of motion, Decreased strength, Impaired UE functional use  Visit Diagnosis: Acute pain of left shoulder  Stiffness of left shoulder, not elsewhere classified  Abnormal posture  Muscle weakness (generalized)     Problem List Patient Active Problem List   Diagnosis Date Noted  . Status post arthroscopy of left shoulder 05/15/2017  . Impingement syndrome of left shoulder 04/23/2017  . Right wrist pain 03/27/2013    Emily Higgins, [name] 06/26/2017, 10:43 AM  [facility] [address] [place], [place], [address] Phone: [phone]   Fax:  [phone]  Name: Emily Higgins MRN: [id number] Date of Birth: 10/25/1965

## 2017-06-28 ENCOUNTER — Ambulatory Visit: Payer: Medicare Other | Admitting: Physical Therapy

## 2017-06-28 ENCOUNTER — Encounter: Payer: Self-pay | Admitting: Physical Therapy

## 2017-06-28 DIAGNOSIS — M25512 Pain in left shoulder: Secondary | ICD-10-CM

## 2017-06-28 DIAGNOSIS — M6281 Muscle weakness (generalized): Secondary | ICD-10-CM

## 2017-06-28 DIAGNOSIS — R293 Abnormal posture: Secondary | ICD-10-CM

## 2017-06-28 DIAGNOSIS — M25612 Stiffness of left shoulder, not elsewhere classified: Secondary | ICD-10-CM

## 2017-06-28 NOTE — Therapy (Signed)
[place], [place], [address] Phone: [phone]   Fax:  [phone]  Physical Therapy Treatment  Patient Details  Name: Emily Higgins MRN: [id number] Date of Birth: Nov 19, 1965 Referring Provider: Dr. Jean Rosenthal   Encounter Date: 06/28/2017  PT End of Session - 06/28/17 1013    Visit Number  8    Number of Visits  16    Date for PT Re-Evaluation  07/26/17    Authorization Type  Medicare    PT Start Time  0930    PT Stop Time  1021    PT Time Calculation (min)  51 min    Activity Tolerance  Patient tolerated treatment well    Behavior During Therapy  [facility] for tasks assessed/performed       Past Medical History:  Diagnosis Date  . Impingement syndrome of left shoulder    severe  . Neuromuscular disorder (HCC)    numbness rt hand  . Obesity     Past Surgical History:  Procedure Laterality Date  . SHOULDER ARTHROSCOPY WITH SUBACROMIAL DECOMPRESSION Left 05/15/2017   Procedure: LEFT SHOULDER ARTHROSCOPY WITH EXTENSIVE DEBRIDEMENT AND SUBACROMIAL DECOMPRESSION;  Surgeon: Mcarthur Rossetti, MD;  Location: [place];  Service: Orthopedics;  Laterality: Left;  . TENDON REPAIR Right 11/13/2012   Procedure: RIGHT WRIST ECU(EXTENSOR CARPI ULNARIS) TENDON STABILIZATION ;  Surgeon: Linna Hoff, MD;  Location: [place];  Service: Orthopedics;  Laterality: Right;  . TUBAL LIGATION    . 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 WISDOM TOOTH EXTRACTION    . WRIST ARTHRODESIS  3/14   tendon repair rt wrist  . WRIST FUSION Right 03/27/2013   Procedure: RIGHT WRIST DISTAL RADIOULNAR JOINT RECONSTRUCTION WITH POSSIBLE PINNNING AND TENDON GRAFTING;  Surgeon: Linna Hoff, MD;  Location: [place];  Service: Orthopedics;  Laterality: Right;    There were no vitals filed for this visit.  Subjective Assessment - 06/28/17 0932    Subjective  shoulder is doing "okay." no pain at beginning  of session.    Patient Stated Goals  learn correct exercises to do at home; help regain strength and motion    Currently in Pain?  No/denies                      [facility] Adult PT Treatment/Exercise - 06/28/17 0932      Shoulder Exercises: Standing   External Rotation  15 reps    Theraband Level (Shoulder External Rotation)  Level 2 (Red)    Internal Rotation  15 reps    Theraband Level (Shoulder Internal Rotation)  Level 2 (Red)    Flexion  Left;10 reps wall ladder; able to slide down without assist     Extension  Both;15 reps;Theraband    Theraband Level (Shoulder Extension)  Level 2 (Red)    Row  15 reps    Theraband Level (Shoulder Row)  Level 2 (Red)      Shoulder Exercises: Pulleys   Flexion  3 minutes    ABduction  3 minutes    ABduction Limitations  scaption      Shoulder Exercises: ROM/Strengthening   UBE (Upper Arm Bike)  L1.0 x 4 min (2' fwd / 2' bwd)    Proximal Shoulder Strengthening, Supine  with Branscum medicine ball: circles CW/CCW x 10 reps; A-Z      Cryotherapy   Number Minutes Cryotherapy  10 Minutes  Cryotherapy Location  Shoulder    Type of Cryotherapy  Ice pack      Manual Therapy   Passive ROM  Lt shoulder all motions (AAROM)               PT Short Term Goals - 06/28/17 1025      PT SHORT TERM GOAL #1   Title  independent with initial HEP    Baseline  pt reports min compliance with HEP    Status  Partially Met      PT SHORT TERM GOAL #2   Title  improve Lt shoulder AROM by 10 degrees all motions for improved mobility      PT SHORT TERM GOAL #3   Title  report pain < 7/10 with activity for improved function    Baseline  3/7: avg up to 5/10; with isolated episode of pain up to 10/10 during PT session on 3/5    Status  Achieved        PT Long Term Goals - 05/31/17 1247      PT LONG TERM GOAL #1   Title  independent with advanced HEP    Status  New    Target Date  07/26/17      PT LONG TERM GOAL #2   Title  report  pain < 4/10 with activity for improved tolerance and function    Status  New    Target Date  07/26/17      PT LONG TERM GOAL #3   Title  Lt shoulder ROM improved to [facility] [place] for improved function and ADLs    Status  New    Target Date  07/26/17      PT LONG TERM GOAL #4   Title  FOTO score improved to </= 36% limited for improved function    Status  New    Target Date  07/26/17      PT LONG TERM GOAL #5   Title  demonstrate 4/5 Lt shoulder strength for improved function    Status  New    Target Date  07/26/17            Plan - 06/28/17 1026    Clinical Impression Statement  Pt tolerated session well today without elevated pain levels, and appropriate response to exercise.  Pt will continue to benefit from PT to maximize function.  All STGs met.    PT Next Visit Plan  FOTO (forgot on 3/7); manual for ROM; AAROM, gentle strengthening exercises    PT Home Exercise Plan  Row, yellow    Consulted and Agree with Plan of Care  Patient       Patient will benefit from skilled therapeutic intervention in order to improve the following deficits and impairments:  Pain, Postural dysfunction, Decreased range of motion, Decreased strength, Impaired UE functional use  Visit Diagnosis: Acute pain of left shoulder  Stiffness of left shoulder, not elsewhere classified  Abnormal posture  Muscle weakness (generalized)     Problem List Patient Active Problem List   Diagnosis Date Noted  . Status post arthroscopy of left shoulder 05/15/2017  . Impingement syndrome of left shoulder 04/23/2017  . Right wrist pain 03/27/2013      Laureen Abrahams, PT, DPT 06/28/17 10:28 AM     [facility] [address] [place], [place], [address] Phone: [phone]   Fax:  [phone]  Name: Emily Higgins MRN: [id number] Date of Birth: [birth year]

## 2017-07-03 ENCOUNTER — Encounter: Payer: Self-pay | Admitting: Physical Therapy

## 2017-07-03 ENCOUNTER — Ambulatory Visit: Payer: Medicare Other | Admitting: Physical Therapy

## 2017-07-03 DIAGNOSIS — M25512 Pain in left shoulder: Secondary | ICD-10-CM | POA: Diagnosis not present

## 2017-07-03 DIAGNOSIS — M25612 Stiffness of left shoulder, not elsewhere classified: Secondary | ICD-10-CM

## 2017-07-03 DIAGNOSIS — M6281 Muscle weakness (generalized): Secondary | ICD-10-CM

## 2017-07-03 DIAGNOSIS — R293 Abnormal posture: Secondary | ICD-10-CM

## 2017-07-03 NOTE — Patient Instructions (Signed)
ROM: Towel Stretch - with Interior Rotation    Pull left arm up behind back by pulling towel up with other arm. Hold _10-15___ seconds. Repeat __5__ times per set. Do __1_ sets per session. Do _2-3__ sessions per day.

## 2017-07-03 NOTE — Therapy (Signed)
[place], [place], [address] Phone: [phone]   Fax:  [phone]  Physical Therapy Treatment  Patient Details  Name: Emily Higgins MRN: [id number] Date of Birth: [birth year] Referring Provider: Dr. Jean Rosenthal   Encounter Date: 07/03/2017  PT End of Session - 07/03/17 1018    Visit Number  9    Number of Visits  16    Date for PT Re-Evaluation  07/26/17    Authorization Type  Medicare    PT Start Time  0928    PT Stop Time  1025 ice pack    PT Time Calculation (min)  57 min    Activity Tolerance  Patient tolerated treatment well    Behavior During Therapy  [facility] for tasks assessed/performed       Past Medical History:  Diagnosis Date  . Impingement syndrome of left shoulder    severe  . Neuromuscular disorder (HCC)    numbness rt hand  . Obesity     Past Surgical History:  Procedure Laterality Date  . SHOULDER ARTHROSCOPY WITH SUBACROMIAL DECOMPRESSION Left 05/15/2017   Procedure: LEFT SHOULDER ARTHROSCOPY WITH EXTENSIVE DEBRIDEMENT AND SUBACROMIAL DECOMPRESSION;  Surgeon: Mcarthur Rossetti, MD;  Location: [place];  Service: Orthopedics;  Laterality: Left;  . TENDON REPAIR Right 11/13/2012   Procedure: RIGHT WRIST ECU(EXTENSOR CARPI ULNARIS) TENDON STABILIZATION ;  Surgeon: Linna Hoff, MD;  Location: [place];  Service: Orthopedics;  Laterality: Right;  . TUBAL LIGATION    . 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 WISDOM TOOTH EXTRACTION    . WRIST ARTHRODESIS  3/14   tendon repair rt wrist  . WRIST FUSION Right 03/27/2013   Procedure: RIGHT WRIST DISTAL RADIOULNAR JOINT RECONSTRUCTION WITH POSSIBLE PINNNING AND TENDON GRAFTING;  Surgeon: Linna Hoff, MD;  Location: [place];  Service: Orthopedics;  Laterality: Right;    There were no vitals filed for this visit.  Subjective Assessment - 07/03/17 0925    Subjective  doing well; having some discomfort  this morning    Patient Stated Goals  learn correct exercises to do at home; help regain strength and motion    Currently in Pain?  Yes    Pain Score  2     Pain Location  Shoulder    Pain Orientation  Left    Pain Descriptors / Indicators  Discomfort    Pain Type  Surgical pain    Pain Onset  More than a month ago    Pain Frequency  Intermittent    Aggravating Factors   lifting pots, laundry, PM    Pain Relieving Factors  rest, meds, heating pad         [facility] PT Assessment - 07/03/17 0933      Assessment   Medical Diagnosis  Lt shoulder scope with SAD and debridement    Referring Provider  Dr. Jean Rosenthal    Onset Date/Surgical Date  05/15/17    Next MD Visit  07/18/17      Observation/Other Assessments   Focus on Therapeutic Outcomes (FOTO)   52 (48% limited)                  [facility] Adult PT Treatment/Exercise - 07/03/17 0930      Shoulder Exercises: Supine   Horizontal ABduction  Left;10 reps;Theraband horizontal addct x 10 reps    Theraband Level (Shoulder Horizontal ABduction)  Level 2 (Red)    External  Rotation  Left;Theraband;10 reps    Theraband Level (Shoulder External Rotation)  Level 2 (Red)    Internal Rotation  Left;10 reps;Theraband    Theraband Level (Shoulder Internal Rotation)  Level 2 (Red)    Flexion  Left;10 reps;AROM with PT overpressure      Shoulder Exercises: Sidelying   ABduction  AROM;Left;10 reps scapular plane; with 3 sec hold at end range      Shoulder Exercises: Standing   Horizontal ABduction  Left;15 reps;Theraband    Theraband Level (Shoulder Horizontal ABduction)  Level 2 (Red)    Extension  Both;15 reps;Theraband from elevated anchor    Theraband Level (Shoulder Extension)  Level 2 (Red)    Other Standing Exercises  Lt "Y" x 15 yellow theraband      Shoulder Exercises: Pulleys   Flexion  3 minutes    ABduction  3 minutes    ABduction Limitations  scaption      Shoulder Exercises: ROM/Strengthening   UBE (Upper  Arm Bike)  L1.0 x 4 min (2' fwd / 2' bwd)      Shoulder Exercises: Stretch   Internal Rotation Stretch  1 rep 30 sec      Cryotherapy   Number Minutes Cryotherapy  15 Minutes    Cryotherapy Location  Shoulder    Type of Cryotherapy  Ice pack             PT Education - 07/03/17 1018    Education provided  Yes    Education Details  IR towel stretch    Person(s) Educated  Patient    Methods  Explanation;Demonstration;Handout    Comprehension  Verbalized understanding;Returned demonstration       PT Short Term Goals - 06/28/17 1025      PT SHORT TERM GOAL #1   Title  independent with initial HEP    Baseline  pt reports min compliance with HEP    Status  Partially Met      PT SHORT TERM GOAL #2   Title  improve Lt shoulder AROM by 10 degrees all motions for improved mobility      PT SHORT TERM GOAL #3   Title  report pain < 7/10 with activity for improved function    Baseline  3/7: avg up to 5/10; with isolated episode of pain up to 10/10 during PT session on 3/5    Status  Achieved        PT Long Term Goals - 05/31/17 1247      PT LONG TERM GOAL #1   Title  independent with advanced HEP    Status  New    Target Date  07/26/17      PT LONG TERM GOAL #2   Title  report pain < 4/10 with activity for improved tolerance and function    Status  New    Target Date  07/26/17      PT LONG TERM GOAL #3   Title  Lt shoulder ROM improved to [facility] for improved function and ADLs    Status  New    Target Date  07/26/17      PT LONG TERM GOAL #4   Title  FOTO score improved to </= 36% limited for improved function    Status  New    Target Date  07/26/17      PT LONG TERM GOAL #5   Title  demonstrate 4/5 Lt shoulder strength for improved function    Status  New  Target Date  07/26/17            Plan - 07/03/17 1019    Clinical Impression Statement  Pt progressing slowly with PT, but feels she is increasing use of LUE at home.  FOTO score improved 10%.  Will  continue to benefit from PT to maximize function.    PT Treatment/Interventions  ADLs/Self Care Home Management;Cryotherapy;Electrical Stimulation;Ultrasound;Moist Heat;Functional mobility training;Therapeutic activities;Therapeutic exercise;Patient/family education;Neuromuscular re-education;Passive range of motion;Manual techniques;Scar mobilization;Taping;Vasopneumatic Device    PT Next Visit Plan  manual for ROM; AAROM, progress strengthening exercises    Consulted and Agree with Plan of Care  Patient       Patient will benefit from skilled therapeutic intervention in order to improve the following deficits and impairments:  Pain, Postural dysfunction, Decreased range of motion, Decreased strength, Impaired UE functional use  Visit Diagnosis: Acute pain of left shoulder  Stiffness of left shoulder, not elsewhere classified  Abnormal posture  Muscle weakness (generalized)     Problem List Patient Active Problem List   Diagnosis Date Noted  . Status post arthroscopy of left shoulder 05/15/2017  . Impingement syndrome of left shoulder 04/23/2017  . Right wrist pain 03/27/2013      Laureen Abrahams, PT, DPT 07/03/17 10:21 AM    [facility] [address] [place], [place], [address] Phone: [phone]   Fax:  [phone]  Name: Emily Higgins MRN: [id number] Date of Birth: 05-Aug-1965

## 2017-07-05 ENCOUNTER — Encounter: Payer: Self-pay | Admitting: Physical Therapy

## 2017-07-05 ENCOUNTER — Ambulatory Visit: Payer: Medicare Other | Admitting: Physical Therapy

## 2017-07-05 DIAGNOSIS — M25512 Pain in left shoulder: Secondary | ICD-10-CM | POA: Diagnosis not present

## 2017-07-05 DIAGNOSIS — M6281 Muscle weakness (generalized): Secondary | ICD-10-CM

## 2017-07-05 DIAGNOSIS — R293 Abnormal posture: Secondary | ICD-10-CM

## 2017-07-05 DIAGNOSIS — M25612 Stiffness of left shoulder, not elsewhere classified: Secondary | ICD-10-CM

## 2017-07-05 NOTE — Therapy (Signed)
[place], [place], [address] Phone: [phone]   Fax:  [phone]  Physical Therapy Treatment  Patient Details  Name: Emily Higgins MRN: [id number] Date of Birth: Oct 06, 1965 Referring Provider: Dr. Jean Rosenthal   Encounter Date: 07/05/2017  PT End of Session - 07/05/17 0926    Visit Number  10    Number of Visits  16    Date for PT Re-Evaluation  07/26/17    PT Start Time  0926    PT Stop Time  1029    PT Time Calculation (min)  63 min    Activity Tolerance  Patient tolerated treatment well    Behavior During Therapy  [facility] for tasks assessed/performed       Past Medical History:  Diagnosis Date  . Impingement syndrome of left shoulder    severe  . Neuromuscular disorder (HCC)    numbness rt hand  . Obesity     Past Surgical History:  Procedure Laterality Date  . SHOULDER ARTHROSCOPY WITH SUBACROMIAL DECOMPRESSION Left 05/15/2017   Procedure: LEFT SHOULDER ARTHROSCOPY WITH EXTENSIVE DEBRIDEMENT AND SUBACROMIAL DECOMPRESSION;  Surgeon: Mcarthur Rossetti, MD;  Location: [place];  Service: Orthopedics;  Laterality: Left;  . TENDON REPAIR Right 11/13/2012   Procedure: RIGHT WRIST ECU(EXTENSOR CARPI ULNARIS) TENDON STABILIZATION ;  Surgeon: Linna Hoff, MD;  Location: [place];  Service: Orthopedics;  Laterality: Right;  . TUBAL LIGATION    . 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 WISDOM TOOTH EXTRACTION    . WRIST ARTHRODESIS  3/14   tendon repair rt wrist  . WRIST FUSION Right 03/27/2013   Procedure: RIGHT WRIST DISTAL RADIOULNAR JOINT RECONSTRUCTION WITH POSSIBLE PINNNING AND TENDON GRAFTING;  Surgeon: Linna Hoff, MD;  Location: [place];  Service: Orthopedics;  Laterality: Right;    There were no vitals filed for this visit.  Subjective Assessment - 07/05/17 0927    Subjective  a little sore today, states she had a sharp pain last night.     Currently  in Pain?  Yes    Pain Score  3     Pain Location  Shoulder    Pain Orientation  Left    Pain Descriptors / Indicators  Sore;Discomfort                      [facility] Adult PT Treatment/Exercise - 07/05/17 0001      Shoulder Exercises: Sidelying   ABduction  AROM;Left;15 reps    Other Sidelying Exercises  Rhythmic stabilization 2x30"    Other Sidelying Exercises  sidelying KB 5# arm bar       Shoulder Exercises: Standing   External Rotation  Theraband;15 reps    Theraband Level (Shoulder External Rotation)  Level 2 (Red)    Internal Rotation  15 reps    Theraband Level (Shoulder Internal Rotation)  Level 2 (Red)    Extension  Both wand, 1x10    Row  20 reps    Theraband Level (Shoulder Row)  Level 2 (Red)    Other Standing Exercises  shoulder ladder flexion, 15 reps    Other Standing Exercises  Standing Y, no band 1x15      Shoulder Exercises: Pulleys   Flexion  3 minutes    ABduction  2 minutes    ABduction Limitations  scaption      Shoulder Exercises: ROM/Strengthening   UBE (Upper Arm Bike)  L  1.0 x 32mn backwards      Cryotherapy   Number Minutes Cryotherapy  15 Minutes    Cryotherapy Location  Shoulder    Type of Cryotherapy  Ice pack               PT Short Term Goals - 06/28/17 1025      PT SHORT TERM GOAL #1   Title  independent with initial HEP    Baseline  pt reports min compliance with HEP    Status  Partially Met      PT SHORT TERM GOAL #2   Title  improve Lt shoulder AROM by 10 degrees all motions for improved mobility      PT SHORT TERM GOAL #3   Title  report pain < 7/10 with activity for improved function    Baseline  3/7: avg up to 5/10; with isolated episode of pain up to 10/10 during PT session on 3/5    Status  Achieved        PT Long Term Goals - 05/31/17 1247      PT LONG TERM GOAL #1   Title  independent with advanced HEP    Status  New    Target Date  07/26/17      PT LONG TERM GOAL #2   Title  report pain <  4/10 with activity for improved tolerance and function    Status  New    Target Date  07/26/17      PT LONG TERM GOAL #3   Title  [facility]    Status  New    Target Date  07/26/17      PT LONG TERM GOAL #4   Title  FOTO score improved to </= 36% limited for improved function    Status  New    Target Date  07/26/17      PT LONG TERM GOAL #5   Title  demonstrate 4/5 Lt shoulder strength for improved function    Status  New    Target Date  07/26/17            Plan - 07/05/17 1015    Clinical Impression Statement  Pt progressing well, reports doing her exercises at home. Required minimal cuing to decrease activity of upper trap with exercises. Pt able to perform stabilization exercises with minimal cuing. Continue improving strength and stability exercises.     PT Treatment/Interventions  ADLs/Self Care Home Management;Cryotherapy;Electrical Stimulation;Ultrasound;Moist Heat;Functional mobility training;Therapeutic activities;Therapeutic exercise;Patient/family education;Neuromuscular re-education;Passive range of motion;Manual techniques;Scar mobilization;Taping;Vasopneumatic Device    PT Next Visit Plan  manual for ROM; AAROM, progress strengthening exercises and stabilization exercises.     PT Home Exercise Plan  Row, yellow, Standing shoulder extension with wand/stick, Standing Y    Consulted and Agree with Plan of Care  Patient       Patient will benefit from skilled therapeutic intervention in order to improve the following deficits and impairments:  Pain, Postural dysfunction, Decreased range of motion, Decreased strength, Impaired UE functional use  Visit Diagnosis: Acute pain of left shoulder  Stiffness of left shoulder, not elsewhere classified  Abnormal posture  Muscle weakness (generalized)     Problem List Patient Active Problem List   Diagnosis Date Noted  . Status post arthroscopy of left shoulder  05/15/2017  . Impingement syndrome of left shoulder 04/23/2017  . Right wrist pain 03/27/2013    Rishard Delange C. Merrell Rettinger PT, DPT 07/05/17  12:05 PM   [place], [place], [address] Phone: [phone]   Fax:  [phone]  Name: Emily Higgins MRN: [id number] Date of Birth: 07-23-1965

## 2017-07-10 ENCOUNTER — Encounter: Payer: Self-pay | Admitting: Physical Therapy

## 2017-07-10 ENCOUNTER — Ambulatory Visit: Payer: Medicare Other | Admitting: Physical Therapy

## 2017-07-10 DIAGNOSIS — R293 Abnormal posture: Secondary | ICD-10-CM

## 2017-07-10 DIAGNOSIS — M6281 Muscle weakness (generalized): Secondary | ICD-10-CM

## 2017-07-10 DIAGNOSIS — M25512 Pain in left shoulder: Secondary | ICD-10-CM

## 2017-07-10 DIAGNOSIS — M25612 Stiffness of left shoulder, not elsewhere classified: Secondary | ICD-10-CM

## 2017-07-10 NOTE — Therapy (Signed)
[place], [place], [address] Phone: [phone]   Fax:  [phone]  Physical Therapy Treatment  Patient Details  Name: Emily Higgins MRN: [id number] Date of Birth: 03/09/1966 Referring Provider: Dr. Jean Rosenthal   Encounter Date: 07/10/2017  PT End of Session - 07/10/17 1011    Visit Number  11    Number of Visits  16    Date for PT Re-Evaluation  07/26/17    Authorization Type  Medicare    PT Start Time  0930    PT Stop Time  1025    PT Time Calculation (min)  55 min    Activity Tolerance  Patient tolerated treatment well    Behavior During Therapy  [facility] for tasks assessed/performed       Past Medical History:  Diagnosis Date  . Impingement syndrome of left shoulder    severe  . Neuromuscular disorder (HCC)    numbness rt hand  . Obesity     Past Surgical History:  Procedure Laterality Date  . SHOULDER ARTHROSCOPY WITH SUBACROMIAL DECOMPRESSION Left 05/15/2017   Procedure: LEFT SHOULDER ARTHROSCOPY WITH EXTENSIVE DEBRIDEMENT AND SUBACROMIAL DECOMPRESSION;  Surgeon: Mcarthur Rossetti, MD;  Location: [place];  Service: Orthopedics;  Laterality: Left;  . TENDON REPAIR Right 11/13/2012   Procedure: RIGHT WRIST ECU(EXTENSOR CARPI ULNARIS) TENDON STABILIZATION ;  Surgeon: Linna Hoff, MD;  Location: [place];  Service: Orthopedics;  Laterality: Right;  . TUBAL LIGATION    . 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 WISDOM TOOTH EXTRACTION    . WRIST ARTHRODESIS  3/14   tendon repair rt wrist  . WRIST FUSION Right 03/27/2013   Procedure: RIGHT WRIST DISTAL RADIOULNAR JOINT RECONSTRUCTION WITH POSSIBLE PINNNING AND TENDON GRAFTING;  Surgeon: Linna Hoff, MD;  Location: [place];  Service: Orthopedics;  Laterality: Right;    There were no vitals filed for this visit.  Subjective Assessment - 07/10/17 0935    Subjective  tried to sleep in the bed last night so  shoulder is a little more painful today.    Patient Stated Goals  learn correct exercises to do at home; help regain strength and motion    Currently in Pain?  Yes    Pain Score  5     Pain Location  Shoulder    Pain Orientation  Left    Pain Descriptors / Indicators  Sore;Discomfort    Pain Type  Surgical pain    Pain Onset  More than a month ago    Pain Frequency  Intermittent    Aggravating Factors   lifting pots, laundry, PM, trying to sleep in bed    Pain Relieving Factors  rest, meds, heating pad                      [facility] Adult PT Treatment/Exercise - 07/10/17 0936      Shoulder Exercises: Standing   External Rotation  Left;Theraband;20 reps    Theraband Level (Shoulder External Rotation)  Level 3 ([place])    Internal Rotation  Left;20 reps;Theraband    Theraband Level (Shoulder Internal Rotation)  Level 3 ([place])    Extension  Both;20 reps;Theraband    Theraband Level (Shoulder Extension)  Level 3 ([place])    Row  20 reps    Theraband Level (Shoulder Row)  Level 3 ([place])    Other Standing Exercises  standing cane IR 10x5 sec hold;  flexion with palms up and 2# on cane x 15 reps    Other Standing Exercises  standing flexion, scaption from counter to cabinet height 1#; Left x 20 reps      Shoulder Exercises: Pulleys   Flexion  3 minutes    ABduction  2 minutes    ABduction Limitations  scaption      Shoulder Exercises: ROM/Strengthening   UBE (Upper Arm Bike)  L 1.0 x 99mn (fwd/bwd 2.5' each)      Cryotherapy   Number Minutes Cryotherapy  15 Minutes    Cryotherapy Location  Shoulder    Type of Cryotherapy  Ice pack               PT Short Term Goals - 06/28/17 1025      PT SHORT TERM GOAL #1   Title  independent with initial HEP    Baseline  pt reports min compliance with HEP    Status  Partially Met      PT SHORT TERM GOAL #2   Title  improve Lt shoulder AROM by 10 degrees all motions for improved mobility      PT SHORT TERM GOAL #3    Title  report pain < 7/10 with activity for improved function    Baseline  3/7: avg up to 5/10; with isolated episode of pain up to 10/10 during PT session on 3/5    Status  Achieved        PT Long Term Goals - 05/31/17 1247      PT LONG TERM GOAL #1   Title  independent with advanced HEP    Status  New    Target Date  07/26/17      PT LONG TERM GOAL #2   Title  report pain < 4/10 with activity for improved tolerance and function    Status  New    Target Date  07/26/17      PT LONG TERM GOAL #3   Title  Lt shoulder [facility] and [facility]    Status  New    Target Date  07/26/17      PT LONG TERM GOAL #4   Title  FOTO score improved to </= 36% limited for improved function    Status  New    Target Date  07/26/17      PT LONG TERM GOAL #5   Title  demonstrate 4/5 Lt shoulder strength for improved function    Status  New    Target Date  07/26/17            Plan - 07/10/17 1011    Clinical Impression Statement  Pt tolerated session well today and discussed using LUE more at home for functional tasks.  Plan to see through [facility] then renew 2x/wk x 4-6 week depending on progress.    PT Treatment/Interventions  ADLs/Self Care Home Management;Cryotherapy;Electrical Stimulation;Ultrasound;Moist Heat;Functional mobility training;Therapeutic activities;Therapeutic exercise;Patient/family education;Neuromuscular re-education;Passive range of motion;Manual techniques;Scar mobilization;Taping;Vasopneumatic Device    PT Next Visit Plan  manual for ROM; AAROM, progress strengthening exercises and stabilization exercises.     Consulted and Agree with Plan of Care  Patient       Patient will benefit from skilled therapeutic intervention in order to improve the following deficits and impairments:  Pain, Postural dysfunction, Decreased range of motion, Decreased strength, Impaired UE functional use  Visit Diagnosis: Acute pain of left shoulder  Stiffness of  left shoulder, not elsewhere classified  Abnormal posture  Muscle weakness (generalized)     Problem List Patient Active Problem List   Diagnosis Date Noted  . Status post arthroscopy of left shoulder 05/15/2017  . Impingement syndrome of left shoulder 04/23/2017  . Right wrist pain 03/27/2013      Laureen Abrahams, PT, DPT 07/10/17 10:13 AM    [facility] [address] [place], [place], [address] Phone: [phone]   Fax:  [phone]  Name: Emily Higgins MRN: [id number] Date of Birth: 1966-02-22

## 2017-07-12 ENCOUNTER — Ambulatory Visit: Payer: Medicare Other | Admitting: Physical Therapy

## 2017-07-12 ENCOUNTER — Encounter: Payer: Self-pay | Admitting: Physical Therapy

## 2017-07-12 DIAGNOSIS — M25512 Pain in left shoulder: Secondary | ICD-10-CM | POA: Diagnosis not present

## 2017-07-12 DIAGNOSIS — M6281 Muscle weakness (generalized): Secondary | ICD-10-CM

## 2017-07-12 DIAGNOSIS — M25612 Stiffness of left shoulder, not elsewhere classified: Secondary | ICD-10-CM

## 2017-07-12 DIAGNOSIS — R293 Abnormal posture: Secondary | ICD-10-CM

## 2017-07-12 NOTE — Therapy (Addendum)
[place], [place], [address] Phone: [phone]   Fax:  [phone]  Physical Therapy Treatment/Discharge Summary  Patient Details  Name: Emily Higgins MRN: [id number] Date of Birth: 1965/08/01 Referring Provider: Dr. Jean Rosenthal   Encounter Date: 07/12/2017  PT End of Session - 07/12/17 1142    Visit Number  12    Number of Visits  16    Date for PT Re-Evaluation  07/26/17    Authorization Type  Medicare    PT Start Time  1059    PT Stop Time  1149    PT Time Calculation (min)  50 min    Activity Tolerance  Patient tolerated treatment well    Behavior During Therapy  [facility] for tasks assessed/performed       Past Medical History:  Diagnosis Date  . Impingement syndrome of left shoulder    severe  . Neuromuscular disorder (HCC)    numbness rt hand  . Obesity     Past Surgical History:  Procedure Laterality Date  . SHOULDER ARTHROSCOPY WITH SUBACROMIAL DECOMPRESSION Left 05/15/2017   Procedure: LEFT SHOULDER ARTHROSCOPY WITH EXTENSIVE DEBRIDEMENT AND SUBACROMIAL DECOMPRESSION;  Surgeon: Mcarthur Rossetti, MD;  Location: [place];  Service: Orthopedics;  Laterality: Left;  . TENDON REPAIR Right 11/13/2012   Procedure: RIGHT WRIST ECU(EXTENSOR CARPI ULNARIS) TENDON STABILIZATION ;  Surgeon: Linna Hoff, MD;  Location: [place];  Service: Orthopedics;  Laterality: Right;  . TUBAL LIGATION    . 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 WISDOM TOOTH EXTRACTION    . WRIST ARTHRODESIS  3/14   tendon repair rt wrist  . WRIST FUSION Right 03/27/2013   Procedure: RIGHT WRIST DISTAL RADIOULNAR JOINT RECONSTRUCTION WITH POSSIBLE PINNNING AND TENDON GRAFTING;  Surgeon: Linna Hoff, MD;  Location: [place];  Service: Orthopedics;  Laterality: Right;    There were no vitals filed for this visit.  Subjective Assessment - 07/12/17 1102    Subjective  slept in her bed, and for  longer periods of time    Patient Stated Goals  learn correct exercises to do at home; help regain strength and motion    Currently in Pain?  No/denies    Pain Score  0-No pain                      [facility] Adult PT Treatment/Exercise - 07/12/17 1103      Shoulder Exercises: Standing   Other Standing Exercises  standing flexion, scaption from counter to cabinet height 1#; Left x 20 reps      Shoulder Exercises: Pulleys   Flexion  3 minutes    ABduction  2 minutes    ABduction Limitations  scaption      Shoulder Exercises: ROM/Strengthening   UBE (Upper Arm Bike)  L 2.0 x 41mn (fwd/bwd 2.5' each)    Wall Wash  2# vertical/lateral/CW/CCW x 15 reps each    Rhythmic Stabilization, Supine  [place] medicine ball A-Z      Shoulder Exercises: Body Blade   Flexion  15 seconds;5 reps    ABduction  15 seconds;5 reps    External Rotation  15 seconds;5 reps      Cryotherapy   Number Minutes Cryotherapy  15 Minutes    Cryotherapy Location  Shoulder    Type of Cryotherapy  Ice pack  PT Short Term Goals - 06/28/17 1025      PT SHORT TERM GOAL #1   Title  independent with initial HEP    Baseline  pt reports min compliance with HEP    Status  Partially Met      PT SHORT TERM GOAL #2   Title  improve Lt shoulder AROM by 10 degrees all motions for improved mobility      PT SHORT TERM GOAL #3   Title  report pain < 7/10 with activity for improved function    Baseline  3/7: avg up to 5/10; with isolated episode of pain up to 10/10 during PT session on 3/5    Status  Achieved        PT Long Term Goals - 05/31/17 1247      PT LONG TERM GOAL #1   Title  independent with advanced HEP    Status  New    Target Date  07/26/17      PT LONG TERM GOAL #2   Title  report pain < 4/10 with activity for improved tolerance and function    Status  New    Target Date  07/26/17      PT LONG TERM GOAL #3   Title  Lt shoulder ROM improved to [facility] for improved  function and ADLs    Status  New    Target Date  07/26/17      PT LONG TERM GOAL #4   Title  FOTO score improved to </= 36% limited for improved function    Status  New    Target Date  07/26/17      PT LONG TERM GOAL #5   Title  demonstrate 4/5 Lt shoulder strength for improved function    Status  New    Target Date  07/26/17            Plan - 07/12/17 1142    Clinical Impression Statement  Pt tolerated functional strengthening tasks well today with expected muscle fatigue response.  Progressing well with PT and able to do more at home with LUE and now sleeping in bed.  To see MD 3/27.    PT Treatment/Interventions  ADLs/Self Care Home Management;Cryotherapy;Electrical Stimulation;Ultrasound;Moist Heat;Functional mobility training;Therapeutic activities;Therapeutic exercise;Patient/family education;Neuromuscular re-education;Passive range of motion;Manual techniques;Scar mobilization;Taping;Vasopneumatic Device    PT Next Visit Plan  manual for ROM; AAROM, progress strengthening exercises and stabilization exercises. needs MD note (measure/strength)    Consulted and Agree with Plan of Care  Patient       Patient will benefit from skilled therapeutic intervention in order to improve the following deficits and impairments:  Pain, Postural dysfunction, Decreased range of motion, Decreased strength, Impaired UE functional use  Visit Diagnosis: Acute pain of left shoulder  Stiffness of left shoulder, not elsewhere classified  Abnormal posture  Muscle weakness (generalized)     Problem List Patient Active Problem List   Diagnosis Date Noted  . Status post arthroscopy of left shoulder 05/15/2017  . Impingement syndrome of left shoulder 04/23/2017  . Right wrist pain 03/27/2013      Laureen Abrahams, PT, DPT 07/12/17 11:44 AM    [facility] [address] [place], [place], [address] Phone: [phone]   Fax:   [phone]  Name: Emily Higgins MRN: [id number] Date of Birth: 01-Dec-1965  PHYSICAL THERAPY DISCHARGE SUMMARY  Visits from Start of Care: 12  Current functional level related to goals / functional outcomes: See above  Remaining deficits: See above   Education / Equipment: Anatomy of condition, POC, HEP, exercise form/rationale  Plan: Patient agrees to discharge.  Patient goals were partially met. Patient is being discharged due to being pleased with the current functional level.  ?????    Pt called to cancel appointments stating she was released from MD and feels confident in her HEP.  Jessica C. Hightower PT, DPT 07/19/17 10:06 AM

## 2017-07-17 ENCOUNTER — Ambulatory Visit: Payer: Medicare Other | Admitting: Physical Therapy

## 2017-07-18 ENCOUNTER — Ambulatory Visit (INDEPENDENT_AMBULATORY_CARE_PROVIDER_SITE_OTHER): Payer: Medicare Other | Admitting: Orthopaedic Surgery

## 2017-07-18 ENCOUNTER — Encounter (INDEPENDENT_AMBULATORY_CARE_PROVIDER_SITE_OTHER): Payer: Self-pay | Admitting: Orthopaedic Surgery

## 2017-07-18 DIAGNOSIS — Z9889 Other specified postprocedural states: Secondary | ICD-10-CM

## 2017-07-18 NOTE — Progress Notes (Signed)
The patient is on a now 64 days status post a left shoulder arthroscopy with extensive debridement and subacromial decompression.  She has been going to physical therapy as well.  She states she has some discomfort but overall feels like she is made excellent progress.  On exam she is moving her left shoulder much more fluidly.  I can tell from a pain standpoint that she is improved dramatically.  At this point she will transition to home exercise program.  She can follow-up as needed.  She will avoid heavy lifting overhead.  All questions and concerns were answered and addressed.

## 2017-07-19 ENCOUNTER — Ambulatory Visit: Payer: Medicare Other | Admitting: Physical Therapy

## 2017-07-24 ENCOUNTER — Encounter: Payer: Medicare Other | Admitting: Physical Therapy

## 2017-07-26 ENCOUNTER — Encounter: Payer: Medicare Other | Admitting: Physical Therapy

## 2017-07-31 ENCOUNTER — Encounter: Payer: Medicare Other | Admitting: Physical Therapy

## 2017-08-02 ENCOUNTER — Encounter: Payer: Medicare Other | Admitting: Physical Therapy

## 2017-08-07 ENCOUNTER — Encounter: Payer: Medicare Other | Admitting: Physical Therapy

## 2017-08-09 ENCOUNTER — Encounter: Payer: Medicare Other | Admitting: Physical Therapy

## 2019-01-18 IMAGING — MR MR SHOULDER*L* W/O CM
5 series · 40 of 40 positions shown · non-contrast
Comparison: CT left shoulder 03/10/2017

CLINICAL DATA: Left shoulder pain for 2 months.

EXAM:
MRI OF THE LEFT SHOULDER WITHOUT CONTRAST
TECHNIQUE: Multiplanar, multisequence MR imaging of the shoulder was performed.
No intravenous contrast was administered.

[Series 5: T2 fat-sat · axial · 4.0mm · 0.55mm/px · z∈[-48,+21]mm · 8 of 17 slices shown (1 of 3)]
[im 1/17]
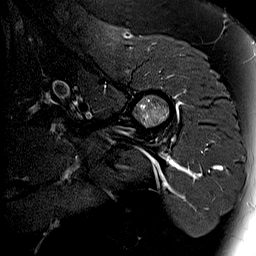
[im 3/17]
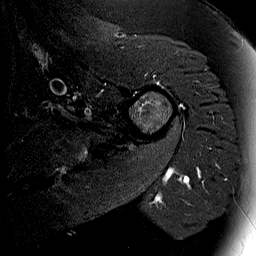
[im 5/17]
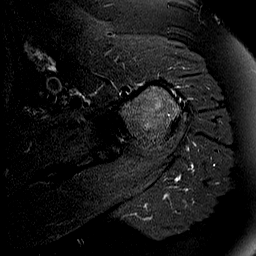
[im 7/17]
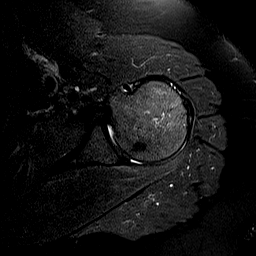
[im 10/17]
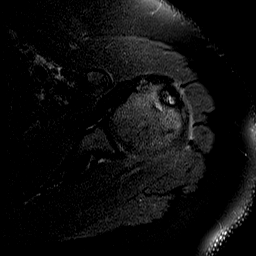
[im 12/17]
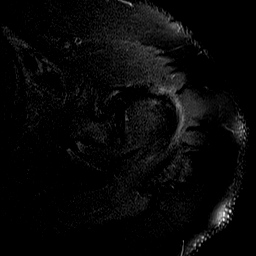
[im 14/17]
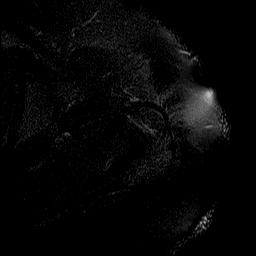
[im 17/17]
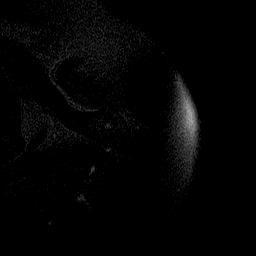

[Series 6: T2 fat-sat · oblique · 4.0mm · 0.62mm/px · 8 of 16 slices shown (2 of 3)]
[im 1/16]
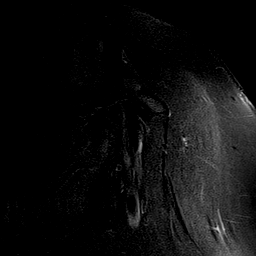
[im 3/16]
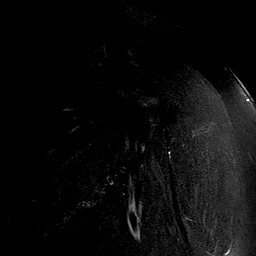
[im 5/16]
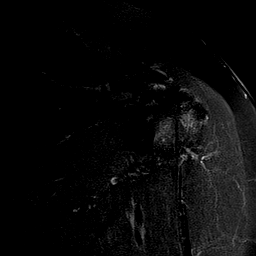
[im 7/16]
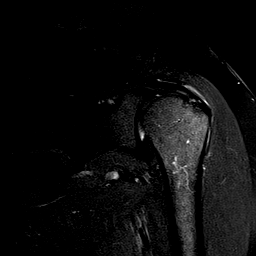
[im 9/16]
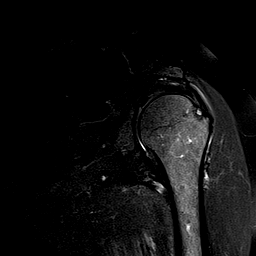
[im 11/16]
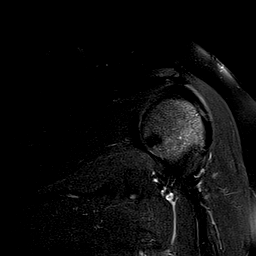
[im 13/16]
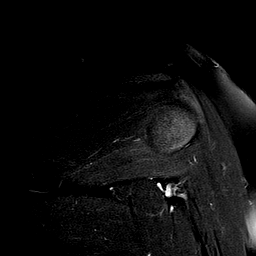
[im 16/16]
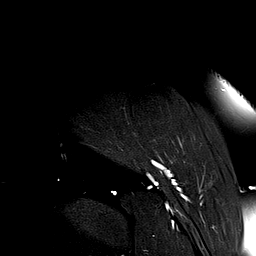

[Series 7: PD · oblique · 4.0mm · 0.62mm/px · 8 of 16 slices shown]
[im 1/16]
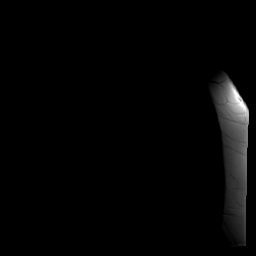
[im 3/16]
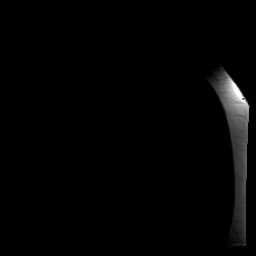
[im 5/16]
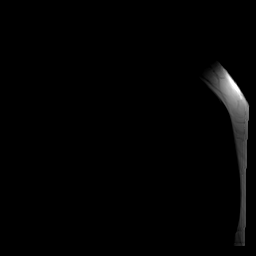
[im 7/16]
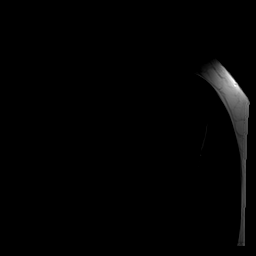
[im 9/16]
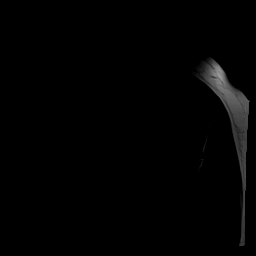
[im 11/16]
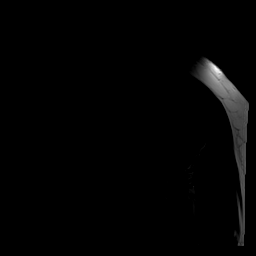
[im 13/16]
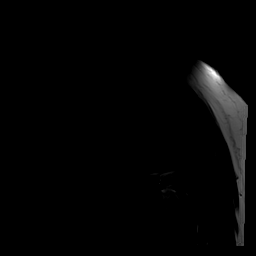
[im 16/16]
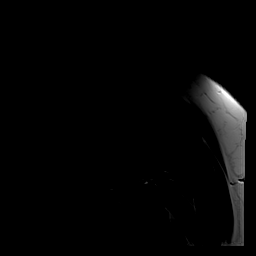

[Series 8: T2 fat-sat · oblique · 4.0mm · 0.62mm/px · 8 of 16 slices shown (3 of 3)]
[im 1/16]
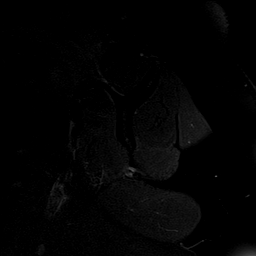
[im 3/16]
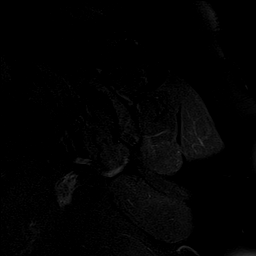
[im 5/16]
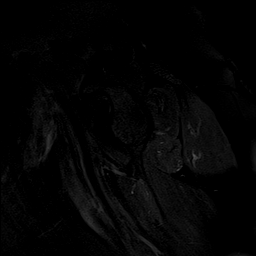
[im 7/16]
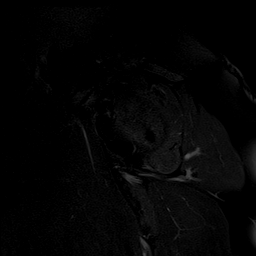
[im 9/16]
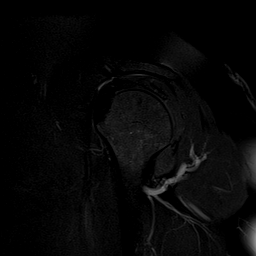
[im 11/16]
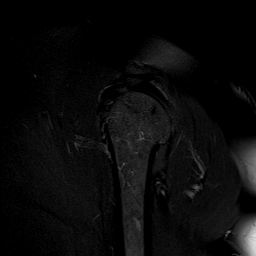
[im 13/16]
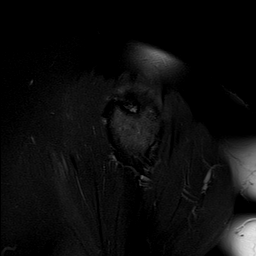
[im 16/16]
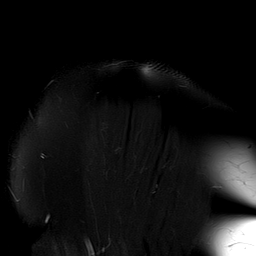

[Series 9: T1 · oblique · 4.0mm · 0.31mm/px · 8 of 16 slices shown]
[im 1/16]
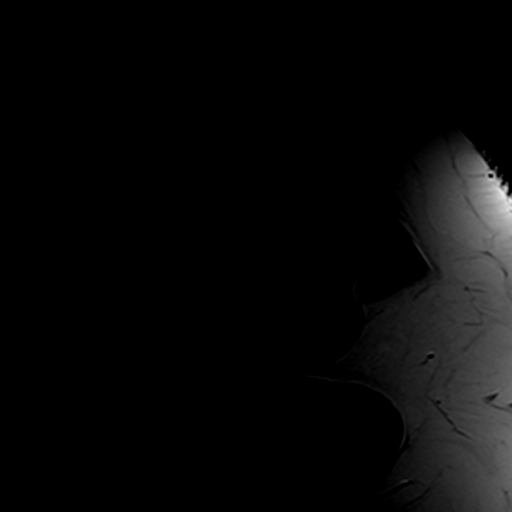
[im 3/16]
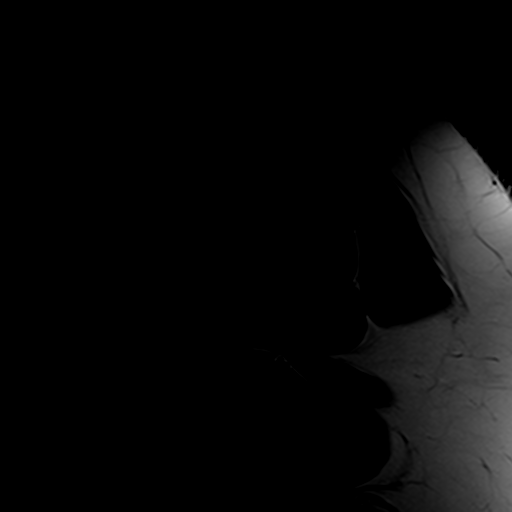
[im 5/16]
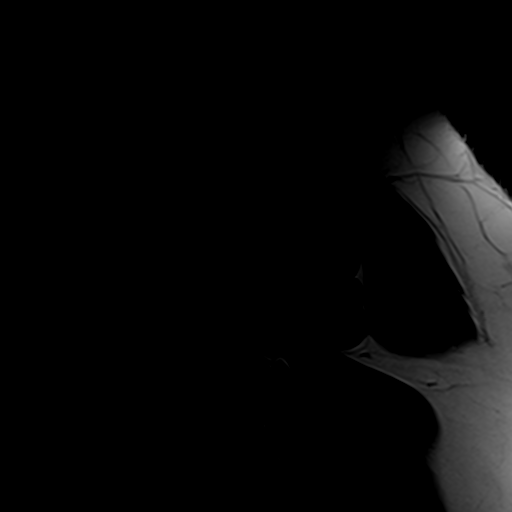
[im 7/16]
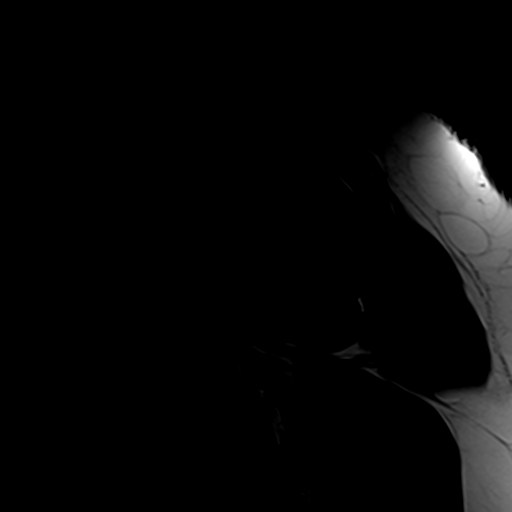
[im 9/16]
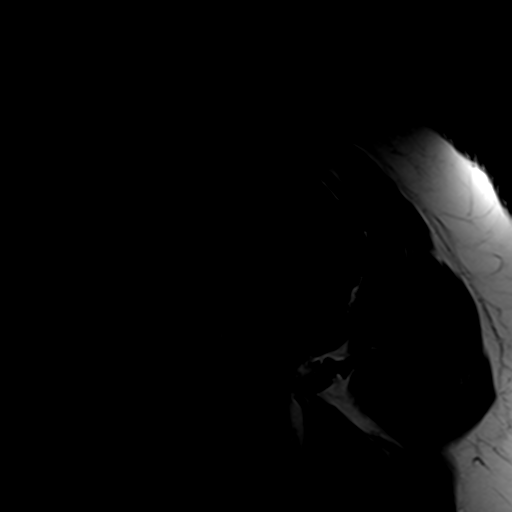
[im 11/16]
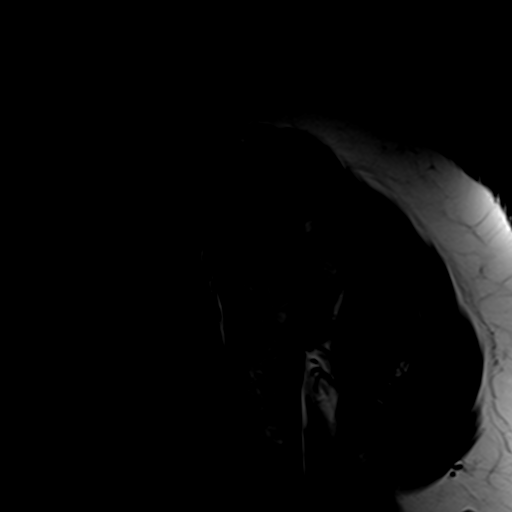
[im 13/16]
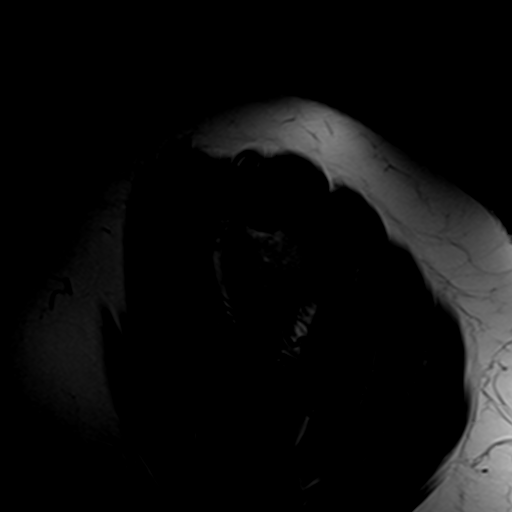
[im 16/16]
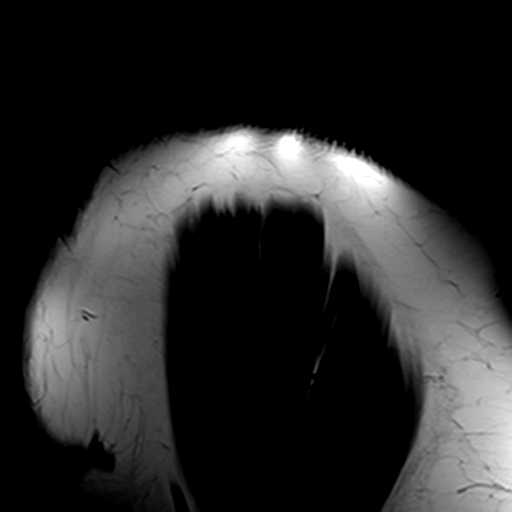

[40 of 40 positions shown; findings below may reference images not displayed]

FINDINGS: Rotator cuff: Moderate rotator cuff tendinopathy/tendinosis with
interstitial tears involving the supraspinatus and infraspinatus
tendons. There is also a shallow oblique coursing articular surface
tear involving the supraspinatus tendon in its midportion and a
small bursal surface tear involving the anterior attachment region.
No full-thickness retracted tear. The subscapularis tendon is
intact.

Muscles:  Normal

Biceps long head:  Intact

Acromioclavicular Joint: Moderate AC joint degenerative changes with
inferior spurring. Type 2 acromion. No lateral downsloping or
significant undersurface spurring.

Glenohumeral Joint: Mild glenohumeral joint degenerative changes
with small joint effusion. There is thickening of the capsular
structures in the axillary recess which can be seen with adhesive
capsulitis or synovitis.

Labrum:  No obvious labral tears.

Bones: No acute bony findings. Subchondral cystic change noted in
the humeral head near the greater tuberosity.

Other: Mild subacromial/ subdeltoid bursitis.
IMPRESSION: 1. Moderate rotator cuff tendinopathy/tendinosis mainly involving
the supraspinatus tendon. There are interstitial tears along with
shallow bursal and articular surface tears but no full-thickness
retracted tear.
2. Moderate AC joint degenerative changes with inferior spurring.
3. Intact long head biceps tendon and glenoid labrum.
4. Mild glenohumeral joint degenerative changes with small joint
effusion and synovitis versus adhesive capsulitis.
5. Mild subacromial/subdeltoid bursitis.

## 2021-10-08 NOTE — ED Notes (Signed)
 Pt received dc papers, given rx, advised to follow up w ortho (they should reach out to her), advised sling and splint instructions, dissovable sutures, and return to ED for any worsening s/s. Pt understood, granddaughter driving pt home, pt a&o x 4, ambulates independently   Manuelita CHRISTELLA Potters, RN 10/08/21 1736

## 2024-05-26 ENCOUNTER — Other Ambulatory Visit: Payer: Self-pay

## 2024-05-26 ENCOUNTER — Inpatient Hospital Stay (HOSPITAL_BASED_OUTPATIENT_CLINIC_OR_DEPARTMENT_OTHER)
Admission: EM | Admit: 2024-05-26 | Payer: Self-pay | Source: Home / Self Care | Attending: Family Medicine | Admitting: Family Medicine

## 2024-05-26 ENCOUNTER — Emergency Department (HOSPITAL_BASED_OUTPATIENT_CLINIC_OR_DEPARTMENT_OTHER): Payer: Self-pay

## 2024-05-26 ENCOUNTER — Encounter (HOSPITAL_BASED_OUTPATIENT_CLINIC_OR_DEPARTMENT_OTHER): Payer: Self-pay

## 2024-05-26 DIAGNOSIS — K6389 Other specified diseases of intestine: Secondary | ICD-10-CM | POA: Diagnosis present

## 2024-05-26 DIAGNOSIS — K639 Disease of intestine, unspecified: Principal | ICD-10-CM

## 2024-05-26 LAB — CBC
HCT: 41.6 % (ref 36.0–46.0)
Hemoglobin: 13.6 g/dL (ref 12.0–15.0)
MCH: 27 pg (ref 26.0–34.0)
MCHC: 32.7 g/dL (ref 30.0–36.0)
MCV: 82.5 fL (ref 80.0–100.0)
Platelets: 296 10*3/uL (ref 150–400)
RBC: 5.04 MIL/uL (ref 3.87–5.11)
RDW: 14.8 % (ref 11.5–15.5)
WBC: 6.2 10*3/uL (ref 4.0–10.5)
nRBC: 0 % (ref 0.0–0.2)

## 2024-05-26 LAB — URINALYSIS, ROUTINE W REFLEX MICROSCOPIC
Bilirubin Urine: NEGATIVE
Glucose, UA: NEGATIVE mg/dL
Leukocytes,Ua: NEGATIVE
Nitrite: NEGATIVE
Protein, ur: 100 mg/dL — AB
Specific Gravity, Urine: >1.046 — ABNORMAL HIGH (ref 1.005–1.030)
pH: 6.5 (ref 5.0–8.0)

## 2024-05-26 LAB — COMPREHENSIVE METABOLIC PANEL WITH GFR
ALT: 8 U/L (ref 0–44)
AST: 21 U/L (ref 15–41)
Albumin: 4.1 g/dL (ref 3.5–5.0)
Alkaline Phosphatase: 95 U/L (ref 38–126)
Anion gap: 16 — ABNORMAL HIGH (ref 5–15)
BUN: 12 mg/dL (ref 6–20)
CO2: 24 mmol/L (ref 22–32)
Calcium: 10.1 mg/dL (ref 8.9–10.3)
Chloride: 102 mmol/L (ref 98–111)
Creatinine, Ser: 0.73 mg/dL (ref 0.44–1.00)
GFR, Estimated: >60 mL/min
Glucose, Bld: 144 mg/dL — ABNORMAL HIGH (ref 70–99)
Potassium: 4.1 mmol/L (ref 3.5–5.1)
Sodium: 141 mmol/L (ref 135–145)
Total Bilirubin: 0.4 mg/dL (ref 0.0–1.2)
Total Protein: 7.6 g/dL (ref 6.5–8.1)

## 2024-05-26 LAB — LIPASE, BLOOD: Lipase: 15 U/L (ref 11–51)

## 2024-05-26 LAB — TROPONIN T, HIGH SENSITIVITY: Troponin T High Sensitivity: <6 ng/L (ref 0–19)

## 2024-05-26 MED ORDER — HYDROMORPHONE HCL 1 MG/ML IJ SOLN
0.5000 mg | Freq: Once | INTRAMUSCULAR | Status: AC
  Administered 2024-05-26: 0.5 mg via INTRAVENOUS
  Filled 2024-05-26: qty 1

## 2024-05-26 MED ORDER — FENTANYL CITRATE (PF) 50 MCG/ML IJ SOSY: 12.5000 ug | PREFILLED_SYRINGE | INTRAMUSCULAR | Status: DC | PRN

## 2024-05-26 MED ORDER — OXYCODONE HCL 5 MG PO TABS
5.0000 mg | ORAL_TABLET | ORAL | Status: AC | PRN
  Administered 2024-05-26 – 2024-05-30 (×7): 5 mg via ORAL
  Filled 2024-05-26 (×9): qty 1

## 2024-05-26 MED ORDER — LACTATED RINGERS IV BOLUS
1000.0000 mL | Freq: Once | INTRAVENOUS | Status: AC
  Administered 2024-05-26: 1000 mL via INTRAVENOUS

## 2024-05-26 MED ORDER — IOHEXOL 300 MG/ML  SOLN
100.0000 mL | Freq: Once | INTRAMUSCULAR | Status: AC | PRN
  Administered 2024-05-26: 100 mL via INTRAVENOUS

## 2024-05-26 MED ORDER — PANTOPRAZOLE SODIUM 40 MG IV SOLR
40.0000 mg | Freq: Once | INTRAVENOUS | Status: AC
  Administered 2024-05-26: 40 mg via INTRAVENOUS
  Filled 2024-05-26: qty 10

## 2024-05-26 MED ORDER — ONDANSETRON HCL 4 MG/2ML IJ SOLN
4.0000 mg | Freq: Once | INTRAMUSCULAR | Status: AC
  Administered 2024-05-30: 4 mg via INTRAVENOUS
  Filled 2024-05-26: qty 2

## 2024-05-26 MED ORDER — SODIUM CHLORIDE 0.9 % IV SOLN: INTRAVENOUS | Status: DC

## 2024-05-26 MED ORDER — PEG 3350-KCL-NA BICARB-NACL 420 G PO SOLR
4000.0000 mL | Freq: Once | ORAL | Status: AC
  Administered 2024-05-26: 4000 mL via ORAL
  Filled 2024-05-26: qty 4000

## 2024-05-26 MED ORDER — ALBUTEROL SULFATE (2.5 MG/3ML) 0.083% IN NEBU: 2.5000 mg | INHALATION_SOLUTION | RESPIRATORY_TRACT | Status: AC | PRN

## 2024-05-26 MED ORDER — ONDANSETRON HCL 4 MG/2ML IJ SOLN
4.0000 mg | Freq: Once | INTRAMUSCULAR | Status: AC | PRN
  Administered 2024-05-26: 4 mg via INTRAVENOUS

## 2024-05-26 MED ORDER — SODIUM CHLORIDE 0.9 % IV SOLN: INTRAVENOUS | Status: AC

## 2024-05-26 MED ORDER — TRAZODONE HCL 50 MG PO TABS: 25.0000 mg | ORAL_TABLET | Freq: Every evening | ORAL | Status: AC | PRN

## 2024-05-26 MED ORDER — FENTANYL CITRATE (PF) 50 MCG/ML IJ SOSY
50.0000 ug | PREFILLED_SYRINGE | Freq: Once | INTRAMUSCULAR | Status: AC
  Administered 2024-05-26: 50 ug via INTRAVENOUS
  Filled 2024-05-26: qty 1

## 2024-05-26 MED ORDER — ONDANSETRON HCL 4 MG PO TABS: 4.0000 mg | ORAL_TABLET | Freq: Four times a day (QID) | ORAL | Status: AC | PRN

## 2024-05-26 MED ORDER — HYDROMORPHONE HCL 1 MG/ML IJ SOLN
1.0000 mg | INTRAMUSCULAR | Status: AC | PRN
  Administered 2024-05-26 – 2024-05-30 (×10): 1 mg via INTRAVENOUS
  Filled 2024-05-26 (×10): qty 1

## 2024-05-26 MED ORDER — HYDROCHLOROTHIAZIDE 25 MG PO TABS
25.0000 mg | ORAL_TABLET | Freq: Every day | ORAL | Status: AC
  Administered 2024-05-26 – 2024-05-29 (×4): 25 mg via ORAL
  Filled 2024-05-26 (×4): qty 1

## 2024-05-26 MED ORDER — ONDANSETRON HCL 4 MG/2ML IJ SOLN: 4.0000 mg | Freq: Four times a day (QID) | INTRAMUSCULAR | Status: AC | PRN

## 2024-05-26 MED ORDER — DICYCLOMINE HCL 10 MG PO CAPS
10.0000 mg | ORAL_CAPSULE | Freq: Once | ORAL | Status: AC
  Administered 2024-05-26: 10 mg via ORAL
  Filled 2024-05-26: qty 1

## 2024-05-26 MED ORDER — ACETAMINOPHEN 650 MG RE SUPP: 650.0000 mg | Freq: Four times a day (QID) | RECTAL | Status: AC | PRN

## 2024-05-26 MED ORDER — ACETAMINOPHEN 325 MG PO TABS: 650.0000 mg | ORAL_TABLET | Freq: Four times a day (QID) | ORAL | Status: AC | PRN

## 2024-05-26 MED ORDER — HYDRALAZINE HCL 20 MG/ML IJ SOLN
5.0000 mg | Freq: Four times a day (QID) | INTRAMUSCULAR | Status: AC | PRN
  Administered 2024-05-30 (×3): 5 mg via INTRAVENOUS
  Filled 2024-05-26 (×2): qty 1

## 2024-05-26 NOTE — ED Notes (Signed)
[facility] at bedside to transport patient. 

## 2024-05-26 NOTE — H&P (Signed)
 " History and Physical  Emily Higgins FMW:[id number] DOB: October 24, 1965 DOA: 05/26/2024  PCP: Joeann Meiers, MD   Chief Complaint: Abdominal pain, vomiting, diarrhea  HPI: Emily Higgins is a 59 y.o. female with no significant medical history who presents with several days of abdominal discomfort, vomiting and diarrhea found to have evidence of suspected splenic flexure mass and associated partial bowel obstruction.  History is provided by the patient, who states that she was in her usual state of health until about 2 to 3 days ago, when she started having some mid abdominal discomfort, and in the last 24 hours started having vomiting and diarrhea.  Denies any fevers or chills, weight loss, blood in her stool or emesis.  She has never had a colonoscopy and does not have a PCP.  Review of Systems: Please see HPI for pertinent positives and negatives. A complete 10 system review of systems are otherwise negative.  Past Medical History:  Diagnosis Date   Impingement syndrome of left shoulder    severe   Neuromuscular disorder (HCC)    numbness rt hand   Obesity    Past Surgical History:  Procedure Laterality Date   SHOULDER ARTHROSCOPY WITH SUBACROMIAL DECOMPRESSION Left 05/15/2017   Procedure: LEFT SHOULDER ARTHROSCOPY WITH EXTENSIVE DEBRIDEMENT AND SUBACROMIAL DECOMPRESSION;  Surgeon: Vernetta Lonni GRADE, MD;  Location: [facility];  Service: Orthopedics;  Laterality: Left;   TENDON REPAIR Right 11/13/2012   Procedure: RIGHT WRIST ECU(EXTENSOR CARPI ULNARIS) TENDON STABILIZATION ;  Surgeon: Prentice LELON Pagan, MD;  Location: [facility];  Service: Orthopedics;  Laterality: Right;   TUBAL LIGATION     TUMOR REMOVAL  5/06   right neck-benign   WISDOM TOOTH EXTRACTION     WRIST ARTHRODESIS  3/14   tendon repair rt wrist   WRIST FUSION Right 03/27/2013   Procedure: RIGHT WRIST DISTAL RADIOULNAR JOINT RECONSTRUCTION WITH POSSIBLE PINNNING AND TENDON GRAFTING;  Surgeon: Prentice LELON Pagan,  MD;  Location: [facility];  Service: Orthopedics;  Laterality: Right;   Social History:  reports that she has never smoked. She has never used smokeless tobacco. She reports current alcohol use. She reports that she does not use drugs.  Allergies[1]  Family History  Problem Relation Age of Onset   Diabetes Mother    Cancer Mother        Breast   Hypertension Mother      Prior to Admission medications  Medication Sig Start Date End Date Taking? Authorizing Provider  ibuprofen (ADVIL,MOTRIN) 200 MG tablet Take 200-400 mg by mouth every 6 (six) hours as needed for headache, moderate pain (pain score 4-6) or mild pain (pain score 1-3).   Yes [provider]  gabapentin (NEURONTIN) 300 MG capsule Take by mouth. Patient not taking: Reported on 05/26/2024 04/25/24   [provider]  predniSONE (DELTASONE) 20 MG tablet Take 20 mg by mouth 2 (two) times daily. Patient not taking: Reported on 05/26/2024 04/25/24   [provider]  tiZANidine (ZANAFLEX) 4 MG tablet Take by mouth. Patient not taking: Reported on 05/26/2024 04/25/24   [provider]    Physical Exam: BP (!) 160/100   Pulse 86   Temp 98.3 F (36.8 C) (Oral)   Resp 16   Ht 5' (1.524 m)   Wt 90.7 kg   LMP 04/29/2017   SpO2 100%   BMI 39.06 kg/m  General:  Alert, oriented, calm, in no acute distress, sitting up in a chair looks comfortable, her daughter is  at the bedside Cardiovascular: RRR, no murmurs or rubs, no peripheral edema  Respiratory: clear to auscultation bilaterally, no wheezes, no crackles  Abdomen: soft, nontender, nondistended, normal bowel tones heard  Skin: dry, no rashes  Musculoskeletal: no joint effusions, normal range of motion  Psychiatric: appropriate affect, normal speech  Neurologic: extraocular muscles intact, clear speech, moving all extremities with intact sensorium         Labs on Admission:  Basic Metabolic Panel: Recent Labs  Lab 05/26/24 0311  NA 141  K 4.1  CL  102  CO2 24  GLUCOSE 144*  BUN 12  CREATININE 0.73  CALCIUM 10.1   Liver Function Tests: Recent Labs  Lab 05/26/24 0311  AST 21  ALT 8  ALKPHOS 95  BILITOT 0.4  PROT 7.6  ALBUMIN 4.1   Recent Labs  Lab 05/26/24 0311  LIPASE 15   No results for input(s): AMMONIA in the last 168 hours. CBC: Recent Labs  Lab 05/26/24 0311  WBC 6.2  HGB 13.6  HCT 41.6  MCV 82.5  PLT 296   Cardiac Enzymes: No results for input(s): CKTOTAL, CKMB, CKMBINDEX, TROPONINI in the last 168 hours. BNP (last 3 results) No results for input(s): BNP in the last 8760 hours.  ProBNP (last 3 results) No results for input(s): PROBNP in the last 8760 hours.  CBG: No results for input(s): GLUCAP in the last 168 hours.  Radiological Exams on Admission: CT ABDOMEN PELVIS W CONTRAST Result Date: 05/26/2024 EXAM: CT ABDOMEN AND PELVIS WITH CONTRAST 05/26/2024 04:27:19 AM TECHNIQUE: CT of the abdomen and pelvis was performed with the administration of 100 mL iohexol  (OMNIPAQUE ) 300 MG/ML solution. Multiplanar reformatted images are provided for review. Automated exposure control, iterative reconstruction, and/or weight-based adjustment of the mA/kV was utilized to reduce the radiation dose to as low as reasonably achievable. COMPARISON: CT of the abdomen and pelvis dated 03/28/2012. CLINICAL HISTORY: Right Lower Quadrant (RLQ) abdominal pain; epigastric, suprapubic, RLQ pain, no tenderness. Right lower quadrant abdominal pain; epigastric, suprapubic, and right lower quadrant pain. FINDINGS: LOWER CHEST: No acute abnormality. LIVER: The liver is unremarkable. GALLBLADDER AND BILE DUCTS: Gallbladder is unremarkable. No biliary ductal dilatation. SPLEEN: No acute abnormality. PANCREAS: Node along the ventral surface of the tail of the pancreas seen on image 26, measuring approximately 1.3 x 0.9 x 1.6 cm. Differential diagnoses include metastatic disease (from the colonic primary), lymphoma, or  inflammatory lymphadenopathy (e.g., pancreatitis, infection). Follow-up guidelines include further characterization with dedicated pancreatic imaging (e.g., MRI with contrast, Endoscopic Ultrasound with Fine Needle Aspiration (EUS with FNA)) if the colonic mass is confirmed malignant, or follow-up CT to assess stability or change. ADRENAL GLANDS: No acute abnormality. KIDNEYS, URETERS AND BLADDER: Simple partially exophytic cyst arising laterally from the left kidney, measuring approximately 5.6 cm in diameter. Per consensus, no follow-up is needed for simple Bosniak type 1 and 2 renal cysts, unless the patient has a malignancy history or risk factors. No stones in the kidneys or ureters. No hydronephrosis. No perinephric or periureteral stranding. Urinary bladder is unremarkable. GI AND BOWEL: Stomach demonstrates no acute abnormality. Annular mass-like thickening of the wall of the splenic flexure of the colon, seen on image 18 of series 2. Adjacent lymphadenopathy with a conglomerate nodal lesion seen on image 23, measuring approximately 2.0 x 1.5 x 1.5 cm. The ascending and transverse colon is distended with fluid. Fluid present within the descending colon, which is nondistended. The small bowel is unremarkable. PERITONEUM AND RETROPERITONEUM: No ascites. No free  air. VASCULATURE: Aorta is normal in caliber. LYMPH NODES: Adjacent lymphadenopathy with a conglomerate nodal lesion seen on image 23, measuring approximately 2.0 x 1.5 x 1.5 cm, associated with the splenic flexure colonic mass. Node along the ventral surface of the tail of the pancreas seen on image 26, measuring approximately 1.3 x 0.9 x 1.6 cm. REPRODUCTIVE ORGANS: Calcified fibroid within the fundus of the uterus measuring approximately 2.9 x 2.8 x 3.0 cm. BONES AND SOFT TISSUES: No acute osseous abnormality. No focal soft tissue abnormality. IMPRESSION: 1. 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 Recommend urgent GI/surgical consultation with colonoscopy and tissue diagnosis. 2. Distended, fluid-filled ascending and transverse colon, suspicious for developing/partial large-bowel obstruction proximal to the splenic flexure lesion. 3. Simple partially exophytic left renal cyst measuring 5.6 cm; no follow-up imaging recommended. 4. Calcified uterine fundal fibroid measuring 2.9 x 2.8 x 3.0 cm. Electronically signed by: Evalene Coho MD 05/26/2024 04:59 AM EST RP Workstation: [id number]   Assessment/Plan Emily Higgins is a 59 y.o. female with no significant medical history who presents with several days of abdominal discomfort, vomiting and diarrhea found to have evidence of suspected splenic flexure mass and associated partial bowel obstruction.    Thickening of the splenic flexure-with adjacent lymphadenopathy, causing evidence of partial bowel obstruction, with distention and fluid-filled colon upstream from this area.  Concerning for inflammatory or malignant process. -Inpatient admission -Clear liquid diet for now -Pain and nausea medication as needed -General Surgery and GI have been consulted, may benefit from colonoscopy  Hypertension-not on home medications, currently comfortable so suspect she has uncontrolled hypertension at baseline -Start HCTZ, with IV hydralazine  as needed  DVT prophylaxis: SCDs only    Code Status: Full Code  Consults called: GI, general surgery  Admission status: The appropriate patient status for this patient is INPATIENT. Inpatient status is judged to be reasonable and necessary in order to provide the required intensity of service to ensure the patient's safety. The patient's  presenting symptoms, physical exam findings, and initial radiographic and laboratory data in the context of their chronic comorbidities is felt to place them at high risk for further clinical deterioration. Furthermore, it is not anticipated that the patient will be medically stable for discharge from the hospital within 2 midnights of admission.    I certify that at the point of admission it is my clinical judgment that the patient will require inpatient hospital care spanning beyond 2 midnights from the point of admission due to high intensity of service, high risk for further deterioration and high frequency of surveillance required  Time spent: 53 minutes  Tahmir Kleckner CHRISTELLA Gail MD [facility] Pager [phone]  If 7PM-7AM, please contact night-coverage [email or web address] Password [email or web address]  05/26/2024, 8:55 AM      [1] No Known Allergies  "

## 2024-05-26 NOTE — Consult Note (Signed)
 Reason for Consult: Abnormal CT Referring Physician: Surgical and hospital team  Emily Higgins is an 59 y.o. female.  HPI: Patient seen and examined and her hospital computer chart reviewed and she may have had a colonoscopy in her teens and her family history is negative and she has only had GI symptoms for a few days and has not seen any blood in her bowels and did have chills yesterday but no fever or night sweats and has not been losing weight and her case discussed with her daughter and we answered all of their questions  Past Medical History:  Diagnosis Date   Impingement syndrome of left shoulder    severe   Neuromuscular disorder (HCC)    numbness rt hand   Obesity     Past Surgical History:  Procedure Laterality Date   SHOULDER ARTHROSCOPY WITH SUBACROMIAL DECOMPRESSION Left 05/15/2017   Procedure: LEFT SHOULDER ARTHROSCOPY WITH EXTENSIVE DEBRIDEMENT AND SUBACROMIAL DECOMPRESSION;  Surgeon: Vernetta Lonni GRADE, MD;  Location: [facility];  Service: Orthopedics;  Laterality: Left;   TENDON REPAIR Right 11/13/2012   Procedure: RIGHT WRIST ECU(EXTENSOR CARPI ULNARIS) TENDON STABILIZATION ;  Surgeon: Prentice LELON Pagan, MD;  Location: [facility];  Service: Orthopedics;  Laterality: Right;   TUBAL LIGATION     TUMOR REMOVAL  5/06   right neck-benign   WISDOM TOOTH EXTRACTION     WRIST ARTHRODESIS  3/14   tendon repair rt wrist   WRIST FUSION Right 03/27/2013   Procedure: RIGHT WRIST DISTAL RADIOULNAR JOINT RECONSTRUCTION WITH POSSIBLE PINNNING AND TENDON GRAFTING;  Surgeon: Prentice LELON Pagan, MD;  Location: [facility];  Service: Orthopedics;  Laterality: Right;    Family History  Problem Relation Age of Onset   Diabetes Mother    Cancer Mother        Breast   Hypertension Mother     Social History:  reports that she has never smoked. She has never used smokeless tobacco. She reports current alcohol use. She reports that she does not use drugs.  Allergies:  Allergies[1]  Medications: I have reviewed the patient's current medications.  Results for orders placed or performed during the hospital encounter of 05/26/24 (from the past 48 hours)  Lipase, blood     Status: None   Collection Time: 05/26/24  3:11 AM  Result Value Ref Range   Lipase 15 11 - 51 U/L    Comment: Performed at [facility], [address] [place], [place] [address]  Comprehensive metabolic panel     Status: Abnormal   Collection Time: 05/26/24  3:11 AM  Result Value Ref Range   Sodium 141 135 - 145 mmol/L   Potassium 4.1 3.5 - 5.1 mmol/L   Chloride 102 98 - 111 mmol/L   CO2 24 22 - 32 mmol/L   Glucose, Bld 144 (H) 70 - 99 mg/dL    Comment: Glucose reference range applies only to samples taken after fasting for at least 8 hours.   BUN 12 6 - 20 mg/dL   Creatinine, Ser 9.26 0.44 - 1.00 mg/dL   Calcium 89.8 8.9 - 89.6 mg/dL   Total Protein 7.6 6.5 - 8.1 g/dL   Albumin 4.1 3.5 - 5.0 g/dL   AST 21 15 - 41 U/L   ALT 8 0 - 44 U/L   Alkaline Phosphatase 95 38 - 126 U/L   Total Bilirubin 0.4 0.0 - 1.2 mg/dL   GFR, Estimated >39 >39 mL/min    Comment: (NOTE) Calculated  using the CKD-EPI Creatinine Equation (2021)    Anion gap 16 (H) 5 - 15    Comment: Performed at [facility], [address], [place], [place] [address]  CBC     Status: None   Collection Time: 05/26/24  3:11 AM  Result Value Ref Range   WBC 6.2 4.0 - 10.5 K/uL   RBC 5.04 3.87 - 5.11 MIL/uL   Hemoglobin 13.6 12.0 - 15.0 g/dL   HCT 58.3 63.9 - 53.9 %   MCV 82.5 80.0 - 100.0 fL   MCH 27.0 26.0 - 34.0 pg   MCHC 32.7 30.0 - 36.0 g/dL   RDW 85.1 88.4 - 84.4 %   Platelets 296 150 - 400 K/uL   nRBC 0.0 0.0 - 0.2 %    Comment: Performed at [facility], [address] [place], [place] [address]  Troponin T, High Sensitivity     Status: None   Collection Time: 05/26/24  3:11 AM  Result Value Ref Range   Troponin T High Sensitivity <6 0 - 19  ng/L    Comment: (NOTE) Biotin concentrations > 1000 ng/mL falsely decrease TnT results.  Serial cardiac troponin measurements are suggested.  Refer to the Links section for chest pain algorithms and additional  guidance. Performed at [facility], [address], [place], [place] [address]   Urinalysis, Routine w reflex microscopic -Urine, Clean Catch     Status: Abnormal   Collection Time: 05/26/24  6:19 AM  Result Value Ref Range   Color, Urine YELLOW YELLOW   APPearance CLEAR CLEAR   Specific Gravity, Urine >1.046 (H) 1.005 - 1.030   pH 6.5 5.0 - 8.0   Glucose, UA NEGATIVE NEGATIVE mg/dL   Hgb urine dipstick MODERATE (A) NEGATIVE   Bilirubin Urine NEGATIVE NEGATIVE   Ketones, ur TRACE (A) NEGATIVE mg/dL   Protein, ur 899 (A) NEGATIVE mg/dL   Nitrite NEGATIVE NEGATIVE   Leukocytes,Ua NEGATIVE NEGATIVE   RBC / HPF 11-20 0 - 5 RBC/hpf   WBC, UA 6-10 0 - 5 WBC/hpf   Bacteria, UA FEW (A) NONE SEEN   Squamous Epithelial / HPF 0-5 0 - 5 /HPF   Mucus PRESENT    Crystals PRESENT (A) NEGATIVE    Comment: Performed at [facility], [address], [place], [place] [address]    CT ABDOMEN PELVIS W CONTRAST Result Date: 05/26/2024 EXAM: CT ABDOMEN AND PELVIS WITH CONTRAST 05/26/2024 04:27:19 AM TECHNIQUE: CT of the abdomen and pelvis was performed with the administration of 100 mL iohexol  (OMNIPAQUE ) 300 MG/ML solution. Multiplanar reformatted images are provided for review. Automated exposure control, iterative reconstruction, and/or weight-based adjustment of the mA/kV was utilized to reduce the radiation dose to as low as reasonably achievable. COMPARISON: CT of the abdomen and pelvis dated 03/28/2012. CLINICAL HISTORY: Right Lower Quadrant (RLQ) abdominal pain; epigastric, suprapubic, RLQ pain, no tenderness. Right lower quadrant abdominal pain; epigastric, suprapubic, and right lower quadrant pain. FINDINGS: LOWER CHEST: No acute abnormality.  LIVER: The liver is unremarkable. GALLBLADDER AND BILE DUCTS: Gallbladder is unremarkable. No biliary ductal dilatation. SPLEEN: No acute abnormality. PANCREAS: Node along the ventral surface of the tail of the pancreas seen on image 26, measuring approximately 1.3 x 0.9 x 1.6 cm. Differential diagnoses include metastatic disease (from the colonic primary), lymphoma, or inflammatory lymphadenopathy (e.g., pancreatitis, infection). Follow-up guidelines include further characterization with dedicated pancreatic imaging (e.g., MRI with contrast, Endoscopic Ultrasound with Fine Needle Aspiration (EUS with FNA)) if the colonic  mass is confirmed malignant, or follow-up CT to assess stability or change. ADRENAL GLANDS: No acute abnormality. KIDNEYS, URETERS AND BLADDER: Simple partially exophytic cyst arising laterally from the left kidney, measuring approximately 5.6 cm in diameter. Per consensus, no follow-up is needed for simple Bosniak type 1 and 2 renal cysts, unless the patient has a malignancy history or risk factors. No stones in the kidneys or ureters. No hydronephrosis. No perinephric or periureteral stranding. Urinary bladder is unremarkable. GI AND BOWEL: Stomach demonstrates no acute abnormality. Annular mass-like thickening of the wall of the splenic flexure of the colon, seen on image 18 of series 2. Adjacent lymphadenopathy with a conglomerate nodal lesion seen on image 23, measuring approximately 2.0 x 1.5 x 1.5 cm. The ascending and transverse colon is distended with fluid. Fluid present within the descending colon, which is nondistended. The small bowel is unremarkable. PERITONEUM AND RETROPERITONEUM: No ascites. No free air. VASCULATURE: Aorta is normal in caliber. LYMPH NODES: Adjacent lymphadenopathy with a conglomerate nodal lesion seen on image 23, measuring approximately 2.0 x 1.5 x 1.5 cm, associated with the splenic flexure colonic mass. Node along the ventral surface of the tail of the pancreas  seen on image 26, measuring approximately 1.3 x 0.9 x 1.6 cm. REPRODUCTIVE ORGANS: Calcified fibroid within the fundus of the uterus measuring approximately 2.9 x 2.8 x 3.0 cm. BONES AND SOFT TISSUES: No acute osseous abnormality. No focal soft tissue abnormality. IMPRESSION: 1. 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 Recommend urgent GI/surgical consultation with colonoscopy and tissue diagnosis. 2. Distended, fluid-filled ascending and transverse colon, suspicious for developing/partial large-bowel obstruction proximal to the splenic flexure lesion. 3. Simple partially exophytic left renal cyst measuring 5.6 cm; no follow-up imaging recommended. 4. Calcified uterine fundal fibroid measuring 2.9 x 2.8 x 3.0 cm. Electronically signed by: Evalene Coho MD 05/26/2024 04:59 AM EST RP Workstation: [id number]    ROS negative except above Blood pressure (!) 160/100, pulse 86, temperature 98.3 F (36.8 C), temperature source Oral, resp. rate 16, height 5' (1.524 m), weight 90.7 kg, last menstrual period 04/29/2017, SpO2 100%. Physical Exam vital signs stable afebrile no acute distress abdomen is soft there is some minimal midepigastric discomfort but no guarding or rebound labs and CT reviewed  Assessment/Plan: Concerns over splenic flexure colon cancer Plan: The risk benefits methods of colonoscopy was discussed and they had some questions about an endoscopy as well which currently I do not think she needs but will await final surgical note although no mention was on their pending note but the patient and her daughter had questions about it and we have tentatively scheduled her for  10:00 tomorrow morning and we discussed the prep and answered all their questions as above and case was discussed with the surgical team not mentioned above  Jahel Wavra E 05/26/2024, 10:28 AM         [1] No Known Allergies

## 2024-05-26 NOTE — ED Triage Notes (Signed)
 Sharp abd pain in the top, mid stomach x3 day, worsening over the last hour. Vomiting, diarrhea since 2100.

## 2024-05-26 NOTE — Consult Note (Addendum)
 "    Emily Higgins 11/03/1965  [id number].    Requesting MD: Zella, MD Chief Complaint/Reason for Consult: splenic flexure thickening   HPI:  Emily Higgins is a 59 y/o F with PMH obesity who presents with cc worsening abdominal pain. Reports the pain started on Friday 1/30, described as upper abdominal pain that is cramping in nature, became progressively worse bringing her to ED yesterday. Associated with one episode of vomiting yesterday. She denies similar pain in the past but reports 3 weeks of abdominal discomfort, which she attributes to new meds. She is not having pain this morning. States she had a back injury at work 3w ago so she was taking ibuprofen regularly. She was seen seen by a doctor and started on gabapentin/steroids/muscle relaxer. Patient denies fever, chills, or changes in bowel habits. Reports 2-3 BMs daily without melena or hematochezia.   Surgical history of  tubal ligation. States she has never had a colonoscopy. Denies a personal or family history of diverticulitis, Crohn's, inflammatory bowel disease.  NKDA Denies substance use.  Works for [employer]   ROS: Review of Systems  All other systems reviewed and are negative.   Family History  Problem Relation Age of Onset   Diabetes Mother    Cancer Mother        Breast   Hypertension Mother     Past Medical History:  Diagnosis Date   Impingement syndrome of left shoulder    severe   Neuromuscular disorder (HCC)    numbness rt hand   Obesity     Past Surgical History:  Procedure Laterality Date   SHOULDER ARTHROSCOPY WITH SUBACROMIAL DECOMPRESSION Left 05/15/2017   Procedure: LEFT SHOULDER ARTHROSCOPY WITH EXTENSIVE DEBRIDEMENT AND SUBACROMIAL DECOMPRESSION;  Surgeon: Vernetta Lonni GRADE, MD;  Location: [facility];  Service: Orthopedics;  Laterality: Left;   TENDON REPAIR Right 11/13/2012   Procedure: RIGHT WRIST ECU(EXTENSOR CARPI ULNARIS) TENDON STABILIZATION ;  Surgeon: Prentice LELON Pagan, MD;   Location: [facility];  Service: Orthopedics;  Laterality: Right;   TUBAL LIGATION     TUMOR REMOVAL  5/06   right neck-benign   WISDOM TOOTH EXTRACTION     WRIST ARTHRODESIS  3/14   tendon repair rt wrist   WRIST FUSION Right 03/27/2013   Procedure: RIGHT WRIST DISTAL RADIOULNAR JOINT RECONSTRUCTION WITH POSSIBLE PINNNING AND TENDON GRAFTING;  Surgeon: Prentice LELON Pagan, MD;  Location: [facility] OR;  Service: Orthopedics;  Laterality: Right;    Social History:  reports that she has never smoked. She has never used smokeless tobacco. She reports current alcohol use. She reports that she does not use drugs.  Allergies: Allergies[1]  Medications Prior to Admission  Medication Sig Dispense Refill   gabapentin (NEURONTIN) 300 MG capsule Take by mouth.     predniSONE (DELTASONE) 20 MG tablet Take 20 mg by mouth 2 (two) times daily.     tiZANidine (ZANAFLEX) 4 MG tablet Take by mouth.     capsicum (ZOSTRIX) 0.075 % topical cream Apply 1 application 2 (two) times daily topically. (Patient not taking: Reported on 05/04/2017) 28.3 g 0   cyclobenzaprine  (FLEXERIL ) 10 MG tablet Take 1 tablet (10 mg total) by mouth 2 (two) times daily as needed for muscle spasms. (Patient not taking: Reported on 05/04/2017) 20 tablet 0   ibuprofen (ADVIL,MOTRIN) 200 MG tablet Take 400 mg by mouth every 6 (six) hours as needed for headache or moderate pain.      meloxicam  (MOBIC ) 15 MG tablet Take 1  tablet (15 mg total) daily by mouth. (Patient not taking: Reported on 05/04/2017) 14 tablet 0   oxyCODONE -acetaminophen  (PERCOCET/ROXICET) 5-325 MG tablet Take 1-2 tablets by mouth every 6 (six) hours as needed for severe pain. 60 tablet 0   vitamin C  (ASCORBIC ACID ) 500 MG tablet Take 1 tablet (500 mg total) by mouth daily. 50 tablet 0     Physical Exam: Blood pressure (!) 165/111, pulse 86, temperature 98.3 F (36.8 C), temperature source Oral, resp. rate 16, weight 93 kg, last menstrual period 04/29/2017, SpO2  100%. General: Pleasant black female, sitting up in the chair, NAD. HEENT: head -normocephalic, atraumatic; Eyes: PERRLA, no conjunctival injection; anicteric sclerae Neck- Trachea is midlin CV- RRR, normal S1/S2, no M/R/G, no lower extremity edema  Pulm- breathing is non-labored ORA Abd- soft, obese, non-tender, overall non-distended, no masses, hernias, or organomegaly. GU- deferred  MSK- UE/LE symmetrical, no cyanosis, clubbing, or edema. Neuro- CN II-XII grossly in tact, nonfocal exam Psych- Alert and Oriented x3 with appropriate affect Skin: warm and dry, no rashes or lesions   Results for orders placed or performed during the hospital encounter of 05/26/24 (from the past 48 hours)  Lipase, blood     Status: None   Collection Time: 05/26/24  3:11 AM  Result Value Ref Range   Lipase 15 11 - 51 U/L    Comment: Performed at [facility], [address] [place], [place] [address]  Comprehensive metabolic panel     Status: Abnormal   Collection Time: 05/26/24  3:11 AM  Result Value Ref Range   Sodium 141 135 - 145 mmol/L   Potassium 4.1 3.5 - 5.1 mmol/L   Chloride 102 98 - 111 mmol/L   CO2 24 22 - 32 mmol/L   Glucose, Bld 144 (H) 70 - 99 mg/dL    Comment: Glucose reference range applies only to samples taken after fasting for at least 8 hours.   BUN 12 6 - 20 mg/dL   Creatinine, Ser 9.26 0.44 - 1.00 mg/dL   Calcium 89.8 8.9 - 89.6 mg/dL   Total Protein 7.6 6.5 - 8.1 g/dL   Albumin 4.1 3.5 - 5.0 g/dL   AST 21 15 - 41 U/L   ALT 8 0 - 44 U/L   Alkaline Phosphatase 95 38 - 126 U/L   Total Bilirubin 0.4 0.0 - 1.2 mg/dL   GFR, Estimated >39 >39 mL/min    Comment: (NOTE) Calculated using the CKD-EPI Creatinine Equation (2021)    Anion gap 16 (H) 5 - 15    Comment: Performed at [facility], [address] [place], [place] [address]  CBC     Status: None   Collection Time: 05/26/24  3:11 AM  Result Value Ref Range   WBC 6.2 4.0 - 10.5  K/uL   RBC 5.04 3.87 - 5.11 MIL/uL   Hemoglobin 13.6 12.0 - 15.0 g/dL   HCT 58.3 63.9 - 53.9 %   MCV 82.5 80.0 - 100.0 fL   MCH 27.0 26.0 - 34.0 pg   MCHC 32.7 30.0 - 36.0 g/dL   RDW 85.1 88.4 - 84.4 %   Platelets 296 150 - 400 K/uL   nRBC 0.0 0.0 - 0.2 %    Comment: Performed at [facility], [address] [place], [place] [address]  Troponin T, High Sensitivity     Status: None   Collection Time: 05/26/24  3:11 AM  Result Value Ref Range   Troponin T High Sensitivity <6  0 - 19 ng/L    Comment: (NOTE) Biotin concentrations > 1000 ng/mL falsely decrease TnT results.  Serial cardiac troponin measurements are suggested.  Refer to the Links section for chest pain algorithms and additional  guidance. Performed at [facility], [address] [place], [place] [address]   Urinalysis, Routine w reflex microscopic -Urine, Clean Catch     Status: Abnormal   Collection Time: 05/26/24  6:19 AM  Result Value Ref Range   Color, Urine YELLOW YELLOW   APPearance CLEAR CLEAR   Specific Gravity, Urine >1.046 (H) 1.005 - 1.030   pH 6.5 5.0 - 8.0   Glucose, UA NEGATIVE NEGATIVE mg/dL   Hgb urine dipstick MODERATE (A) NEGATIVE   Bilirubin Urine NEGATIVE NEGATIVE   Ketones, ur TRACE (A) NEGATIVE mg/dL   Protein, ur 899 (A) NEGATIVE mg/dL   Nitrite NEGATIVE NEGATIVE   Leukocytes,Ua NEGATIVE NEGATIVE   RBC / HPF 11-20 0 - 5 RBC/hpf   WBC, UA 6-10 0 - 5 WBC/hpf   Bacteria, UA FEW (A) NONE SEEN   Squamous Epithelial / HPF 0-5 0 - 5 /HPF   Mucus PRESENT    Crystals PRESENT (A) NEGATIVE    Comment: Performed at [facility], [address], [place], [place] [address]   CT ABDOMEN PELVIS W CONTRAST Result Date: 05/26/2024 EXAM: CT ABDOMEN AND PELVIS WITH CONTRAST 05/26/2024 04:27:19 AM TECHNIQUE: CT of the abdomen and pelvis was performed with the administration of 100 mL iohexol  (OMNIPAQUE ) 300 MG/ML solution. Multiplanar reformatted  images are provided for review. Automated exposure control, iterative reconstruction, and/or weight-based adjustment of the mA/kV was utilized to reduce the radiation dose to as low as reasonably achievable. COMPARISON: CT of the abdomen and pelvis dated 03/28/2012. CLINICAL HISTORY: Right Lower Quadrant (RLQ) abdominal pain; epigastric, suprapubic, RLQ pain, no tenderness. Right lower quadrant abdominal pain; epigastric, suprapubic, and right lower quadrant pain. FINDINGS: LOWER CHEST: No acute abnormality. LIVER: The liver is unremarkable. GALLBLADDER AND BILE DUCTS: Gallbladder is unremarkable. No biliary ductal dilatation. SPLEEN: No acute abnormality. PANCREAS: Node along the ventral surface of the tail of the pancreas seen on image 26, measuring approximately 1.3 x 0.9 x 1.6 cm. Differential diagnoses include metastatic disease (from the colonic primary), lymphoma, or inflammatory lymphadenopathy (e.g., pancreatitis, infection). Follow-up guidelines include further characterization with dedicated pancreatic imaging (e.g., MRI with contrast, Endoscopic Ultrasound with Fine Needle Aspiration (EUS with FNA)) if the colonic mass is confirmed malignant, or follow-up CT to assess stability or change. ADRENAL GLANDS: No acute abnormality. KIDNEYS, URETERS AND BLADDER: Simple partially exophytic cyst arising laterally from the left kidney, measuring approximately 5.6 cm in diameter. Per consensus, no follow-up is needed for simple Bosniak type 1 and 2 renal cysts, unless the patient has a malignancy history or risk factors. No stones in the kidneys or ureters. No hydronephrosis. No perinephric or periureteral stranding. Urinary bladder is unremarkable. GI AND BOWEL: Stomach demonstrates no acute abnormality. Annular mass-like thickening of the wall of the splenic flexure of the colon, seen on image 18 of series 2. Adjacent lymphadenopathy with a conglomerate nodal lesion seen on image 23, measuring approximately 2.0 x  1.5 x 1.5 cm. The ascending and transverse colon is distended with fluid. Fluid present within the descending colon, which is nondistended. The small bowel is unremarkable. PERITONEUM AND RETROPERITONEUM: No ascites. No free air. VASCULATURE: Aorta is normal in caliber. LYMPH NODES: Adjacent lymphadenopathy with a conglomerate nodal lesion seen on image 23, measuring approximately 2.0 x  1.5 x 1.5 cm, associated with the splenic flexure colonic mass. Node along the ventral surface of the tail of the pancreas seen on image 26, measuring approximately 1.3 x 0.9 x 1.6 cm. REPRODUCTIVE ORGANS: Calcified fibroid within the fundus of the uterus measuring approximately 2.9 x 2.8 x 3.0 cm. BONES AND SOFT TISSUES: No acute osseous abnormality. No focal soft tissue abnormality. IMPRESSION: 1. 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 Recommend urgent GI/surgical consultation with colonoscopy and tissue diagnosis. 2. Distended, fluid-filled ascending and transverse colon, suspicious for developing/partial large-bowel obstruction proximal to the splenic flexure lesion. 3. Simple partially exophytic left renal cyst measuring 5.6 cm; no follow-up imaging recommended. 4. Calcified uterine fundal fibroid measuring 2.9 x 2.8 x 3.0 cm. Electronically signed by: Evalene Coho MD 05/26/2024 04:59 AM EST RP Workstation: [id number]      Assessment/Plan Mass-like thickening of the splenic flexure of the colon, possible partial LBO   - afebrile, VSS  - I have consulted GI, Dr. Rosalie. Ok for CLD. They will see for possible bowel prep/colonoscopy/biopsy. - Add on CEA level. Await GI eval.  - may need  partial colectomy this admission, [facility] will follow closely and make recommendations. No emergent role for surgery today. - discussed this recommendation/plan with the patient and her daughter who was at the bedside.   FEN - CLD, IVF per primary VTE - SCD's, ok for chemical VTE ppx from a [facility] standpoint  ID - no abx indicated  Admit - [facility] service.   I reviewed nursing notes, ED provider notes, hospitalist notes, last 24 h vitals and pain scores, last 48 h intake and output, last 24 h labs and trends, and last 24 h imaging results.  Emily GORMAN Pringle, PA-C [facility] 05/26/2024, 8:14 AM Please see Amion for pager number during day hours 7:00am-4:30pm or 7:00am -11:30am on weekends     [1] No Known Allergies  "

## 2024-05-27 ENCOUNTER — Inpatient Hospital Stay (HOSPITAL_COMMUNITY): Payer: Self-pay | Admitting: Certified Registered Nurse Anesthetist

## 2024-05-27 ENCOUNTER — Encounter (HOSPITAL_COMMUNITY): Payer: Self-pay | Admitting: Internal Medicine

## 2024-05-27 ENCOUNTER — Encounter (HOSPITAL_COMMUNITY): Admission: EM | Payer: Self-pay | Source: Home / Self Care | Attending: Family Medicine

## 2024-05-27 LAB — CBC
HCT: 38.4 % (ref 36.0–46.0)
Hemoglobin: 12.1 g/dL (ref 12.0–15.0)
MCH: 26.7 pg (ref 26.0–34.0)
MCHC: 31.5 g/dL (ref 30.0–36.0)
MCV: 84.8 fL (ref 80.0–100.0)
Platelets: 286 10*3/uL (ref 150–400)
RBC: 4.53 MIL/uL (ref 3.87–5.11)
RDW: 14.9 % (ref 11.5–15.5)
WBC: 5.5 10*3/uL (ref 4.0–10.5)
nRBC: 0 % (ref 0.0–0.2)

## 2024-05-27 LAB — CEA: CEA: 2.7 ng/mL (ref 0.0–4.7)

## 2024-05-27 LAB — BASIC METABOLIC PANEL WITH GFR
Anion gap: 12 (ref 5–15)
BUN: 12 mg/dL (ref 6–20)
CO2: 24 mmol/L (ref 22–32)
Calcium: 9.5 mg/dL (ref 8.9–10.3)
Chloride: 103 mmol/L (ref 98–111)
Creatinine, Ser: 0.67 mg/dL (ref 0.44–1.00)
GFR, Estimated: >60 mL/min
Glucose, Bld: 99 mg/dL (ref 70–99)
Potassium: 3.7 mmol/L (ref 3.5–5.1)
Sodium: 138 mmol/L (ref 135–145)

## 2024-05-27 LAB — HIV ANTIBODY (ROUTINE TESTING W REFLEX): HIV Screen 4th Generation wRfx: NONREACTIVE

## 2024-05-27 MED ORDER — PROPOFOL 500 MG/50ML IV EMUL
INTRAVENOUS | Status: DC | PRN
  Administered 2024-05-27: 50 mg via INTRAVENOUS
  Administered 2024-05-27: 30 mg via INTRAVENOUS
  Administered 2024-05-27: 20 mg via INTRAVENOUS
  Administered 2024-05-27: 125 ug/kg/min via INTRAVENOUS
  Administered 2024-05-27: 30 mg via INTRAVENOUS

## 2024-05-27 MED ORDER — ONDANSETRON HCL 4 MG/2ML IJ SOLN
INTRAMUSCULAR | Status: DC | PRN
  Administered 2024-05-27: 4 mg via INTRAVENOUS

## 2024-05-27 MED ORDER — PHENYLEPHRINE HCL (PRESSORS) 10 MG/ML IV SOLN
INTRAVENOUS | Status: DC | PRN
  Administered 2024-05-27 (×2): 80 ug via INTRAVENOUS
  Administered 2024-05-27: .08 mg via INTRAVENOUS
  Administered 2024-05-27 (×2): 80 ug via INTRAVENOUS

## 2024-05-27 MED ORDER — PROPOFOL 1000 MG/100ML IV EMUL
INTRAVENOUS | Status: AC
  Filled 2024-05-27: qty 100

## 2024-05-27 MED ORDER — SPOT INK MARKER SYRINGE KIT
PACK | SUBMUCOSAL | Status: DC | PRN
  Administered 2024-05-27: 4 mL via SUBMUCOSAL

## 2024-05-27 NOTE — Anesthesia Preprocedure Evaluation (Signed)
 "                                  Anesthesia Evaluation  Patient identified by MRN, date of birth, ID band Patient awake    Reviewed: Allergy & Precautions, NPO status , Patient's Chart, lab work & pertinent test results  Airway Mallampati: II  TM Distance: >3 FB Neck ROM: Full    Dental no notable dental hx.    Pulmonary neg pulmonary ROS   Pulmonary exam normal        Cardiovascular negative cardio ROS  Rhythm:Regular Rate:Normal     Neuro/Psych negative neurological ROS  negative psych ROS   GI/Hepatic negative GI ROS, Neg liver ROS,,,  Endo/Other  negative endocrine ROS    Renal/GU negative Renal ROS  negative genitourinary   Musculoskeletal  (+) Arthritis , Osteoarthritis,    Abdominal Normal abdominal exam  (+)   Peds  Hematology Lab Results      Component                Value               Date                      WBC                      5.5                 05/27/2024                HGB                      12.1                05/27/2024                HCT                      38.4                05/27/2024                MCV                      84.8                05/27/2024                PLT                      286                 05/27/2024             Lab Results      Component                Value               Date                      NA                       138  05/27/2024                K                        3.7                 05/27/2024                CO2                      24                  05/27/2024                GLUCOSE                  99                  05/27/2024                BUN                      12                  05/27/2024                CREATININE               0.67                05/27/2024                CALCIUM                  9.5                 05/27/2024                GFRNONAA                 >60                 05/27/2024              Anesthesia Other Findings    Reproductive/Obstetrics                              Anesthesia Physical Anesthesia Plan  ASA: 2  Anesthesia Plan: MAC   Post-op Pain Management:    Induction:   PONV Risk Score and Plan: 2 and Propofol  infusion and Treatment may vary due to age or medical condition  Airway Management Planned: Simple Face Mask and Nasal Cannula  Additional Equipment: None  Intra-op Plan:   Post-operative Plan:   Informed Consent: I have reviewed the patients History and Physical, chart, labs and discussed the procedure including the risks, benefits and alternatives for the proposed anesthesia with the patient or authorized representative who has indicated his/her understanding and acceptance.     Dental advisory given  Plan Discussed with: CRNA  Anesthesia Plan Comments:         Anesthesia Quick Evaluation  "

## 2024-05-27 NOTE — Anesthesia Postprocedure Evaluation (Signed)
"   Anesthesia Post Note  Patient: Emily Higgins  Procedure(s) Performed: COLONOSCOPY BIOPSY, GI TRACT POLYPECTOMY, INTESTINE     Patient location during evaluation: PACU Anesthesia Type: MAC Level of consciousness: awake and alert Pain management: pain level controlled Vital Signs Assessment: post-procedure vital signs reviewed and stable Respiratory status: spontaneous breathing, nonlabored ventilation, respiratory function stable and patient connected to nasal cannula oxygen Cardiovascular status: stable and blood pressure returned to baseline Postop Assessment: no apparent nausea or vomiting Anesthetic complications: no   No notable events documented.  Last Vitals:  Vitals:   05/27/24 1100 05/27/24 1110  BP: 116/67 (!) 143/88  Pulse: 82 82  Resp: 20 (!) 27  Temp:    SpO2: 96% 99%    Last Pain:  Vitals:   05/27/24 1110  TempSrc:   PainSc: 0-No pain                 [name]      "

## 2024-05-27 NOTE — Plan of Care (Signed)

## 2024-05-27 NOTE — Anesthesia Procedure Notes (Signed)
 Procedure Name: MAC Date/Time: 05/27/2024 10:18 AM  Performed by: Joshua Vernell BROCKS, CRNAPre-anesthesia Checklist: Patient identified, Emergency Drugs available, Suction available and Patient being monitored Patient Re-evaluated:Patient Re-evaluated prior to induction Oxygen Delivery Method: Simple face mask Preoxygenation: Pre-oxygenation with 100% oxygen Induction Type: IV induction Placement Confirmation: positive ETCO2 and breath sounds checked- equal and bilateral Dental Injury: Teeth and Oropharynx as per pre-operative assessment

## 2024-05-27 NOTE — Hospital Course (Signed)
 Recommendation:           - Clear liquid diet.                           - Continue present medications.                           - Await pathology results.                           - Repeat colonoscopy after studies and surgery are                            complete for screening purposes and to make sure no                            right-sided lesions are present probably 3 months                            after surgery.                           - Return to GI office PRN.                           - Telephone GI clinic if symptomatic PRN.                           - Refer to a colo-rectal surgeon today.

## 2024-05-27 NOTE — Progress Notes (Signed)
 Progress Note: General Surgery Service   Chief Complaint/Subjective: Recovering from colonoscopy.  Objective: Vital signs in last 24 hours: Temp:  [97.6 F (36.4 C)-98.6 F (37 C)] 98.2 F (36.8 C) (02/03 1354) Pulse Rate:  [72-98] 76 (02/03 1354) Resp:  [14-27] 18 (02/03 1354) BP: (105-181)/(58-107) 154/80 (02/03 1354) SpO2:  [96 %-99 %] 98 % (02/03 1354) Last BM Date : 05/27/24  Intake/Output from previous day: 02/02 0701 - 02/03 0700 In: 33.2 [I.V.:33.2] Out: -  Intake/Output this shift: Total I/O In: 500 [I.V.:500] Out: -   Constitutional: NAD; conversant; no deformities Eyes: Moist conjunctiva; no lid lag; anicteric; PERRL Neck: Trachea midline; no thyromegaly Lungs: Normal respiratory effort; no tactile fremitus CV: RRR; no palpable thrills; no pitting edema GI: Abd Soft, nontender; no palpable hepatosplenomegaly MSK: Normal range of motion of extremities; no clubbing/cyanosis Psychiatric: Appropriate affect; alert and oriented x3 Lymphatic: No palpable cervical or axillary lymphadenopathy  Lab Results: CBC  Recent Labs    05/26/24 0311 05/27/24 0623  WBC 6.2 5.5  HGB 13.6 12.1  HCT 41.6 38.4  PLT 296 286   BMET Recent Labs    05/26/24 0311 05/27/24 0623  NA 141 138  K 4.1 3.7  CL 102 103  CO2 24 24  GLUCOSE 144* 99  BUN 12 12  CREATININE 0.73 0.67  CALCIUM 10.1 9.5   PT/INR No results for input(s): LABPROT, INR in the last 72 hours. ABG No results for input(s): PHART, HCO3 in the last 72 hours.  Invalid input(s): PCO2, PO2  Anti-infectives: Anti-infectives (From admission, onward)    None       Medications: Scheduled Meds:  hydrochlorothiazide   25 mg Oral Daily   ondansetron  (ZOFRAN ) IV  4 mg Intravenous Once   Continuous Infusions: PRN Meds:.acetaminophen  **OR** acetaminophen , albuterol , hydrALAZINE , HYDROmorphone  (DILAUDID ) injection, ondansetron  **OR** ondansetron  (ZOFRAN ) IV, oxyCODONE ,  traZODone   Assessment/Plan: Ms. Macinnes is a 59 year old female with a nearly completely obstructing sigmoid flexure mass.  We are awaiting pathology from today's colonoscopy.  I suspect she will need a left colectomy later this week.  We discussed laparoscopic left colectomy, its risks, benefits and alternatives.  After a full discussion and all questions answered the patient voiced understanding.  We will wait for pathology report prior to proceeding.  If this is a lymphoma, she may benefit from upfront chemotherapy.   LOS: 1 day   Deward JINNY Foy, MD  [facility] Use [email or web address] to contact on call provider  Daily Billing: [phone] - High MDM

## 2024-05-27 NOTE — Progress Notes (Signed)
 [facility]  Dorsey A Youtsey FMW:[id number]  DOB: 1965-08-01  DOA: 05/26/2024  PCP: Joeann Meiers, MD  05/27/2024,1:03 PM  LOS: 1 day    Code Status: Full code     from: Home  59 year old black female HTN Admit 2/226 with 3-week history of abdominal nausea pain and was treated with gabapentin steroids muscle relaxant Never had a colonoscopy Workup reviewed on CT 1.3 X0.6 X01.6 Pancreatic lesion also had new masslike thickening of the splenic flexure with adjacent lymphadenopathy along with 2.0 notable lesion with possible nodal mets GI General Surgery consulted 2/3 colonoscopy showedInternal hemorrhoids diverticulosis sigmoid diminutive polyp proximal sigmoid malignant completely obstructing tumor splenic flexure biopsied and tattooed unable to bypassed this region-recommended general surgery input and possible right sided colonoscopy in 3 months postsurgery if surgery is done   Assessment  & Plan :    Obstructing colonic mass right side near splenic flexure Colonoscopy as above-pathology has been sent as a rush and is pending Have communicated with general surgery will be on clear liquid diet is able to tolerate --- was passing stool yesterday but is not passing gas today Given near obstructing nature would only keep on this diet Pain is moderate but controlled General surgery made aware of colonoscopy findings will discuss further options her CEA is only 2.7-had mentioned to the family including daughter that still may require surgery with ostomy May need IV fluid based on labs tomorrow going for surgery  Hypertension Blood pressure is elevated continues HCTZ 25 daily, can use hydralazine  5 mg every 6 as needed pressure >160  Data Reviewed today: Sodium 138 potassium 3.7 BUN/creatinine 12/0.6 WBC 5.5 hemoglobin 12.1 platelet 286  DVT prophylaxis: SCD  Dispo/Global plan: Await management decisions from general surgery-if goes to surgery would asked that they kindly take over     Subjective:    Awake coherent just back from colonoscopy looks fair seems to understand clearly that she has an obstruction No severe pain tolerating liquids No flatus or stool since yesterday but the bowel prep  Objective + exam Vitals:   05/27/24 0923 05/27/24 1050 05/27/24 1100 05/27/24 1110  BP: (!) 170/87 (!) 105/58 116/67 (!) 143/88  Pulse: 98 88 82 82  Resp: 14 15 20  (!) 27  Temp: 98 F (36.7 C)     TempSrc: Temporal Temporal    SpO2: 97% 97% 96% 99%  Weight:      Height:       Filed Weights   05/26/24 0306 05/26/24 0853  Weight: 93 kg 90.7 kg     Examination: EOMI NCAT no focal deficit no icterus no pallor no wheeze CTAB no added sound Abdomen obese slight distention no rebound No lower extremity edema S1-S2 no murmur no rub no gallop   Scheduled Meds:  hydrochlorothiazide   25 mg Oral Daily   ondansetron  (ZOFRAN ) IV  4 mg Intravenous Once   Continuous Infusions: acetaminophen  **OR** acetaminophen , albuterol , hydrALAZINE , HYDROmorphone  (DILAUDID ) injection, ondansetron  **OR** ondansetron  (ZOFRAN ) IV, oxyCODONE , traZODone   I spent 36 minutes today before, during and after this patient interview and examination--reviewing pertinent data, coordinating the patient's care and in communication with the care team and other medical professionals  Colen Grimes, MD  [facility]

## 2024-05-27 NOTE — Transfer of Care (Signed)
 Immediate Anesthesia Transfer of Care Note  Patient: Emily Higgins  Procedure(s) Performed: COLONOSCOPY BIOPSY, GI TRACT POLYPECTOMY, INTESTINE  Patient Location: Endoscopy Unit  Anesthesia Type:MAC  Level of Consciousness: drowsy and patient cooperative  Airway & Oxygen Therapy: Patient Spontanous Breathing and Patient connected to face mask oxygen  Post-op Assessment: Report given to RN and Post -op Vital signs reviewed and stable  Post vital signs: Reviewed and stable  Last Vitals:  Vitals Value Taken Time  BP 105/58 05/27/24 10:50  Temp    Pulse    Resp 12 05/27/24 10:51  SpO2    Vitals shown include unfiled device data.  Last Pain:  Vitals:   05/27/24 0923  TempSrc: Temporal  PainSc: 0-No pain      Patients Stated Pain Goal: 0 (05/27/24 0850)  Complications: No notable events documented.

## 2024-05-28 LAB — COMPREHENSIVE METABOLIC PANEL WITH GFR
ALT: <5 U/L (ref 0–44)
AST: 15 U/L (ref 15–41)
Albumin: 3.7 g/dL (ref 3.5–5.0)
Alkaline Phosphatase: 73 U/L (ref 38–126)
Anion gap: 11 (ref 5–15)
BUN: 7 mg/dL (ref 6–20)
CO2: 25 mmol/L (ref 22–32)
Calcium: 9.2 mg/dL (ref 8.9–10.3)
Chloride: 100 mmol/L (ref 98–111)
Creatinine, Ser: 0.7 mg/dL (ref 0.44–1.00)
GFR, Estimated: >60 mL/min
Glucose, Bld: 84 mg/dL (ref 70–99)
Potassium: 3.4 mmol/L — ABNORMAL LOW (ref 3.5–5.1)
Sodium: 136 mmol/L (ref 135–145)
Total Bilirubin: 0.5 mg/dL (ref 0.0–1.2)
Total Protein: 6.3 g/dL — ABNORMAL LOW (ref 6.5–8.1)

## 2024-05-28 LAB — CBC WITH DIFFERENTIAL/PLATELET
Abs Immature Granulocytes: 0.01 10*3/uL (ref 0.00–0.07)
Basophils Absolute: 0 10*3/uL (ref 0.0–0.1)
Basophils Relative: 1 %
Eosinophils Absolute: 0.3 10*3/uL (ref 0.0–0.5)
Eosinophils Relative: 7 %
HCT: 37 % (ref 36.0–46.0)
Hemoglobin: 11.6 g/dL — ABNORMAL LOW (ref 12.0–15.0)
Immature Granulocytes: 0 %
Lymphocytes Relative: 40 %
Lymphs Abs: 1.8 10*3/uL (ref 0.7–4.0)
MCH: 26.8 pg (ref 26.0–34.0)
MCHC: 31.4 g/dL (ref 30.0–36.0)
MCV: 85.5 fL (ref 80.0–100.0)
Monocytes Absolute: 0.4 10*3/uL (ref 0.1–1.0)
Monocytes Relative: 8 %
Neutro Abs: 1.9 10*3/uL (ref 1.7–7.7)
Neutrophils Relative %: 44 %
Platelets: 223 10*3/uL (ref 150–400)
RBC: 4.33 MIL/uL (ref 3.87–5.11)
RDW: 14.7 % (ref 11.5–15.5)
WBC: 4.4 10*3/uL (ref 4.0–10.5)
nRBC: 0 % (ref 0.0–0.2)

## 2024-05-28 MED ORDER — POTASSIUM CHLORIDE CRYS ER 20 MEQ PO TBCR
40.0000 meq | EXTENDED_RELEASE_TABLET | Freq: Once | ORAL | Status: AC
  Administered 2024-05-28: 40 meq via ORAL
  Filled 2024-05-28: qty 2

## 2024-05-28 NOTE — Plan of Care (Signed)

## 2024-05-28 NOTE — Progress Notes (Signed)
 Emily Higgins 10:04 AM  Subjective: Patient doing fine from her colonoscopy if no surgery is to be done she wants her diet advanced has no new complaints and we answered all of her questions  Objective: Vital signs stable afebrile no acute distress in okay spirits not examined today labs okay potassium little low  Assessment: Obstructive mass splenic flexure  Plan: Await biopsy advance diet per surgical team consider adding a CA 19 just to be complete since CEA negative and will return when biopsies are back and call me sooner if I could be of any help with this hospital stay  [facility]  office [phone] After 5PM or if no answer call [phone]

## 2024-05-28 NOTE — Progress Notes (Signed)
" °  Progress Note   Patient: Emily Higgins FMW:[id number] DOB: Sep 25, 1965 DOA: 05/26/2024     2 DOS: the patient was seen and examined on 05/28/2024 at 11:20 AM      Brief hospital course: 59 y.o. F with HTN, obesity, presented with several weeks of abdominal pain, nausea.  In the ER Cr showed a masslike thickening of the colon at the splenic flexure.  GI and General Surgery consulted.    Colonoscopy on HD 2 showed a malignant appearing mass, biopsied.     Assessment and Plan: Colon mass - Await biopsy - Advance to full liquid diet   Class II obesity  Hypertension Blood pressure near controlled - Continue HCTZ         Subjective: No increase in pain with advancing to full liquid diet.  No shortness of breath, no vomiting.  No abdominal distention.     Physical Exam: BP (!) 147/70 (BP Location: Right Arm)   Pulse 67   Temp 98.9 F (37.2 C) (Oral)   Resp 15   Ht 5' (1.524 m)   Wt 90.7 kg   LMP 04/29/2017   SpO2 100%   BMI 39.06 kg/m   Adult female, lying in bed, appears weak and tired RRR, no murmurs, no peripheral edema Respiratory normal, lungs clear without rales or wheezes Abdomen soft, no tenderness palpation or guarding, no ascites or distention   Data Reviewed: Discussed with general surgery Basic metabolic panel shows hypokalemia CBC shows mild anemia     Family Communication:     Disposition: Status is: Inpatient         Author: Lonni SHAUNNA Dalton, MD 05/28/2024 6:07 PM  For on call review [email or web address]    "

## 2024-05-29 LAB — SURGICAL PATHOLOGY

## 2024-05-29 LAB — CBC WITH DIFFERENTIAL/PLATELET
Abs Immature Granulocytes: 0.02 10*3/uL (ref 0.00–0.07)
Basophils Absolute: 0 10*3/uL (ref 0.0–0.1)
Basophils Relative: 1 %
Eosinophils Absolute: 0.4 10*3/uL (ref 0.0–0.5)
Eosinophils Relative: 7 %
HCT: 37.6 % (ref 36.0–46.0)
Hemoglobin: 12.4 g/dL (ref 12.0–15.0)
Immature Granulocytes: 0 %
Lymphocytes Relative: 54 %
Lymphs Abs: 2.8 10*3/uL (ref 0.7–4.0)
MCH: 27.1 pg (ref 26.0–34.0)
MCHC: 33 g/dL (ref 30.0–36.0)
MCV: 82.1 fL (ref 80.0–100.0)
Monocytes Absolute: 0.4 10*3/uL (ref 0.1–1.0)
Monocytes Relative: 8 %
Neutro Abs: 1.5 10*3/uL — ABNORMAL LOW (ref 1.7–7.7)
Neutrophils Relative %: 30 %
Platelets: 233 10*3/uL (ref 150–400)
RBC: 4.58 MIL/uL (ref 3.87–5.11)
RDW: 14.6 % (ref 11.5–15.5)
WBC: 5.1 10*3/uL (ref 4.0–10.5)
nRBC: 0 % (ref 0.0–0.2)

## 2024-05-29 LAB — COMPREHENSIVE METABOLIC PANEL WITH GFR
ALT: 6 U/L (ref 0–44)
AST: 15 U/L (ref 15–41)
Albumin: 3.8 g/dL (ref 3.5–5.0)
Alkaline Phosphatase: 72 U/L (ref 38–126)
Anion gap: 10 (ref 5–15)
BUN: <5 mg/dL — ABNORMAL LOW (ref 6–20)
CO2: 29 mmol/L (ref 22–32)
Calcium: 9.4 mg/dL (ref 8.9–10.3)
Chloride: 97 mmol/L — ABNORMAL LOW (ref 98–111)
Creatinine, Ser: 0.64 mg/dL (ref 0.44–1.00)
GFR, Estimated: >60 mL/min
Glucose, Bld: 97 mg/dL (ref 70–99)
Potassium: 3.9 mmol/L (ref 3.5–5.1)
Sodium: 136 mmol/L (ref 135–145)
Total Bilirubin: 0.4 mg/dL (ref 0.0–1.2)
Total Protein: 6.9 g/dL (ref 6.5–8.1)

## 2024-05-29 LAB — SURGICAL PCR SCREEN
MRSA, PCR: NEGATIVE
Staphylococcus aureus: NEGATIVE

## 2024-05-29 NOTE — Progress Notes (Signed)
" °   05/29/24 0822  TOC Brief Assessment  Insurance and Status Lapsed  Patient has primary care physician Yes Clarence, Friddie, MD)  Home environment has been reviewed from home  Prior level of function: independent  Prior/Current Home Services No current home services  Social Drivers of Health Review SDOH reviewed no interventions necessary  Readmission risk has been reviewed Yes  Transition of care needs no transition of care needs at this time    "

## 2024-05-29 NOTE — Progress Notes (Signed)
" ° ° °  PROCEDURAL EXPEDITER PROGRESS NOTE  Patient Name: Emily Higgins  DOB:[birth year] Date of Admission: 05/26/2024  Date of Assessment:05/29/24   -------------------------------------------------------------------------------------------------------------------   Brief clinical summary: patient going to OR for laparoscopic,hand assisted, partial colectomy on 05-30-24.  Orders in place:   Yes   Communication with surgical team if no orders: none  Labs, test, and orders reviewed: yes  Requires surgical clearance:   No  What type of clearance:  n/a  Clearance received: n/a  Barriers noted: none   Intervention provided by [facility] team: none  Barrier resolved:   none   -------------------------------------------------------------------------------------------------------------------  [facility] Patient Care Command Expediter, Rexene LITTIE Kirks Please contact us  directly via secure chat (search for [facility]) or by calling us  at [phone] [facility]).  "

## 2024-05-29 NOTE — Progress Notes (Signed)
" °  Progress Note   Patient: Emily Higgins FMW:[id number] DOB: 1966-04-13 DOA: 05/26/2024     3 DOS: the patient was seen and examined on 05/29/2024 12:09 PM      Brief hospital course: 59 y.o. F with HTN, obesity, presented with several weeks of abdominal pain, nausea.  In the ER Cr showed a masslike thickening of the colon at the splenic flexure.  GI and General Surgery consulted.    Colonoscopy on HD 2 showed a malignant appearing mass, biopsied.     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     Physical Exam: BP (!) 143/77 (BP Location: Right Arm)   Pulse (!) 58   Temp 99.2 F (37.3 C) (Oral)   Resp 15   Ht 5' (1.524 m)   Wt 90.7 kg   LMP 04/29/2017   SpO2 95%   BMI 39.06 kg/m   Adult female, lying in bed, no acute distress RRR, no murmurs, no peripheral edema Respiratory rate normal, lungs clear without rales or wheezes Abdomen soft, no significant tenderness to palpation or guarding, no ascites or distention Attention normal, affect pleasant, judgment and insight appear normal    Data Reviewed: Basic metabolic panel shows normal electrolytes and renal function CBC normal     Family Communication: Daughter at the bedside    Disposition: Status is: Inpatient         Author: Lonni SHAUNNA Dalton, MD 05/29/2024 6:37 PM  For on call review [email or web address]    "

## 2024-05-29 NOTE — Progress Notes (Signed)
 Progress Note: General Surgery Service   Chief Complaint/Subjective: Tolerating liquid diet.  Objective: Vital signs in last 24 hours: Temp:  [98.5 F (36.9 C)-98.9 F (37.2 C)] 98.7 F (37.1 C) (02/05 0854) Pulse Rate:  [58-82] 82 (02/05 0854) Resp:  [15-18] 18 (02/05 0854) BP: (116-153)/(67-95) 153/95 (02/05 0854) SpO2:  [94 %-100 %] 94 % (02/05 0854) Last BM Date : 05/28/24  Intake/Output from previous day: 02/04 0701 - 02/05 0700 In: 874 [P.O.:874] Out: -  Intake/Output this shift: No intake/output data recorded.  Constitutional: NAD; conversant; no deformities Eyes: Moist conjunctiva; no lid lag; anicteric; PERRL Neck: Trachea midline; no thyromegaly Lungs: Normal respiratory effort; no tactile fremitus CV: RRR; no palpable thrills; no pitting edema GI: Abd Soft, nontender; no palpable hepatosplenomegaly MSK: Normal range of motion of extremities; no clubbing/cyanosis Psychiatric: Appropriate affect; alert and oriented x3 Lymphatic: No palpable cervical or axillary lymphadenopathy  Lab Results: CBC  Recent Labs    05/28/24 0634 05/29/24 0736  WBC 4.4 5.1  HGB 11.6* 12.4  HCT 37.0 37.6  PLT 223 233   BMET Recent Labs    05/28/24 0634 05/29/24 0736  NA 136 136  K 3.4* 3.9  CL 100 97*  CO2 25 29  GLUCOSE 84 97  BUN 7 <5*  CREATININE 0.70 0.64  CALCIUM 9.2 9.4   PT/INR No results for input(s): LABPROT, INR in the last 72 hours. ABG No results for input(s): PHART, HCO3 in the last 72 hours.  Invalid input(s): PCO2, PO2  Anti-infectives: Anti-infectives (From admission, onward)    None       Medications: Scheduled Meds:  hydrochlorothiazide   25 mg Oral Daily   ondansetron  (ZOFRAN ) IV  4 mg Intravenous Once   Continuous Infusions: PRN Meds:.acetaminophen  **OR** acetaminophen , albuterol , hydrALAZINE , HYDROmorphone  (DILAUDID ) injection, ondansetron  **OR** ondansetron  (ZOFRAN ) IV, oxyCODONE , traZODone   Assessment/Plan: Emily Higgins is a 59 year old female with a nearly completely obstructing sigmoid flexure mass.  Discussed with Dr. Rosalie this morning who says the pathology report is back and the tumor is an adenocarcinoma.  I recommended laparoscopic, hand assisted, partial colectomy.  We discussed the procedure, its risks, benefits and alteratives.  After a full discussion and all questions answered the patient granted consent to proceed.  NPO at midinght for surgery tomorrow.    LOS: 3 days   Emily JINNY Foy, MD  [facility] Use [email or web address] to contact on call provider  Daily Billing: [id number] - High MDM

## 2024-05-29 NOTE — Plan of Care (Signed)
  Problem: Clinical Measurements: Goal: Will remain free from infection Outcome: Progressing   Problem: Coping: Goal: Level of anxiety will decrease Outcome: Progressing   Problem: Pain Managment: Goal: General experience of comfort will improve and/or be controlled Outcome: Progressing   Problem: Safety: Goal: Ability to remain free from injury will improve Outcome: Progressing

## 2024-05-29 NOTE — Progress Notes (Signed)
 Patient seen and we discussed the pathology and answered all patient and family's questions regarding surgery postop care etc. and will be on standby to help as needed and I wished her well with surgery and the recovery and we discussed possible postop chemotherapy

## 2024-05-30 ENCOUNTER — Encounter (HOSPITAL_COMMUNITY): Payer: Self-pay | Admitting: Internal Medicine

## 2024-05-30 ENCOUNTER — Inpatient Hospital Stay (HOSPITAL_COMMUNITY): Payer: MEDICAID | Admitting: Anesthesiology

## 2024-05-30 ENCOUNTER — Encounter (HOSPITAL_COMMUNITY): Admission: EM | Payer: Self-pay | Source: Home / Self Care | Attending: Family Medicine

## 2024-05-30 LAB — COMPREHENSIVE METABOLIC PANEL WITH GFR
ALT: 7 U/L (ref 0–44)
AST: 18 U/L (ref 15–41)
Albumin: 3.6 g/dL (ref 3.5–5.0)
Alkaline Phosphatase: 74 U/L (ref 38–126)
Anion gap: 12 (ref 5–15)
BUN: <5 mg/dL — ABNORMAL LOW (ref 6–20)
CO2: 26 mmol/L (ref 22–32)
Calcium: 9.3 mg/dL (ref 8.9–10.3)
Chloride: 101 mmol/L (ref 98–111)
Creatinine, Ser: 0.83 mg/dL (ref 0.44–1.00)
GFR, Estimated: >60 mL/min
Glucose, Bld: 173 mg/dL — ABNORMAL HIGH (ref 70–99)
Potassium: 4.2 mmol/L (ref 3.5–5.1)
Sodium: 139 mmol/L (ref 135–145)
Total Bilirubin: 0.3 mg/dL (ref 0.0–1.2)
Total Protein: 6.7 g/dL (ref 6.5–8.1)

## 2024-05-30 LAB — CBC WITH DIFFERENTIAL/PLATELET
Abs Immature Granulocytes: 0.04 10*3/uL (ref 0.00–0.07)
Basophils Absolute: 0 10*3/uL (ref 0.0–0.1)
Basophils Relative: 0 %
Eosinophils Absolute: 0.1 10*3/uL (ref 0.0–0.5)
Eosinophils Relative: 1 %
HCT: 38.7 % (ref 36.0–46.0)
Hemoglobin: 12.3 g/dL (ref 12.0–15.0)
Immature Granulocytes: 1 %
Lymphocytes Relative: 9 %
Lymphs Abs: 0.7 10*3/uL (ref 0.7–4.0)
MCH: 26.9 pg (ref 26.0–34.0)
MCHC: 31.8 g/dL (ref 30.0–36.0)
MCV: 84.5 fL (ref 80.0–100.0)
Monocytes Absolute: 0.2 10*3/uL (ref 0.1–1.0)
Monocytes Relative: 3 %
Neutro Abs: 7.1 10*3/uL (ref 1.7–7.7)
Neutrophils Relative %: 86 %
Platelets: 235 10*3/uL (ref 150–400)
RBC: 4.58 MIL/uL (ref 3.87–5.11)
RDW: 14.6 % (ref 11.5–15.5)
WBC: 8.1 10*3/uL (ref 4.0–10.5)
nRBC: 0 % (ref 0.0–0.2)

## 2024-05-30 MED ORDER — EPHEDRINE 5 MG/ML INJ
INTRAVENOUS | Status: AC
  Filled 2024-05-30: qty 5

## 2024-05-30 MED ORDER — FENTANYL CITRATE (PF) 50 MCG/ML IJ SOSY
25.0000 ug | PREFILLED_SYRINGE | INTRAMUSCULAR | Status: DC | PRN
  Administered 2024-05-30 (×3): 50 ug via INTRAVENOUS

## 2024-05-30 MED ORDER — PROPOFOL 10 MG/ML IV BOLUS
INTRAVENOUS | Status: DC | PRN
  Administered 2024-05-30: 150 mg via INTRAVENOUS

## 2024-05-30 MED ORDER — KETOROLAC TROMETHAMINE 30 MG/ML IJ SOLN
INTRAMUSCULAR | Status: AC
  Filled 2024-05-30: qty 1

## 2024-05-30 MED ORDER — PHENYLEPHRINE 80 MCG/ML (10ML) SYRINGE FOR IV PUSH (FOR BLOOD PRESSURE SUPPORT)
PREFILLED_SYRINGE | INTRAVENOUS | Status: AC
  Filled 2024-05-30: qty 10

## 2024-05-30 MED ORDER — FENTANYL CITRATE (PF) 100 MCG/2ML IJ SOLN
INTRAMUSCULAR | Status: DC | PRN
  Administered 2024-05-30 (×2): 50 ug via INTRAVENOUS

## 2024-05-30 MED ORDER — LACTATED RINGERS IV SOLN: INTRAVENOUS | Status: DC | PRN

## 2024-05-30 MED ORDER — ACETAMINOPHEN 10 MG/ML IV SOLN
INTRAVENOUS | Status: AC
  Filled 2024-05-30: qty 100

## 2024-05-30 MED ORDER — BUPIVACAINE-EPINEPHRINE (PF) 0.25% -1:200000 IJ SOLN
INTRAMUSCULAR | Status: AC
  Filled 2024-05-30: qty 60

## 2024-05-30 MED ORDER — KETOROLAC TROMETHAMINE 30 MG/ML IJ SOLN
30.0000 mg | Freq: Once | INTRAMUSCULAR | Status: AC
  Administered 2024-05-30: 30 mg via INTRAVENOUS

## 2024-05-30 MED ORDER — ACETAMINOPHEN 10 MG/ML IV SOLN
INTRAVENOUS | Status: DC | PRN
  Administered 2024-05-30: 1000 mg via INTRAVENOUS

## 2024-05-30 MED ORDER — MIDAZOLAM HCL (PF) 2 MG/2ML IJ SOLN
INTRAMUSCULAR | Status: DC | PRN
  Administered 2024-05-30: 2 mg via INTRAVENOUS

## 2024-05-30 MED ORDER — HYDRALAZINE HCL 20 MG/ML IJ SOLN
INTRAMUSCULAR | Status: AC
  Filled 2024-05-30: qty 1

## 2024-05-30 MED ORDER — DEXAMETHASONE SOD PHOSPHATE PF 10 MG/ML IJ SOLN
INTRAMUSCULAR | Status: DC | PRN
  Administered 2024-05-30: 8 mg via INTRAVENOUS

## 2024-05-30 MED ORDER — ROCURONIUM BROMIDE 100 MG/10ML IV SOLN
INTRAVENOUS | Status: DC | PRN
  Administered 2024-05-30: 20 mg via INTRAVENOUS
  Administered 2024-05-30: 50 mg via INTRAVENOUS
  Administered 2024-05-30 (×2): 10 mg via INTRAVENOUS

## 2024-05-30 MED ORDER — ESMOLOL HCL 100 MG/10ML IV SOLN
INTRAVENOUS | Status: DC | PRN
  Administered 2024-05-30: 30 mg via INTRAVENOUS

## 2024-05-30 MED ORDER — OXYCODONE HCL 5 MG PO TABS: 5.0000 mg | ORAL_TABLET | Freq: Once | ORAL | Status: DC | PRN

## 2024-05-30 MED ORDER — FENTANYL CITRATE (PF) 100 MCG/2ML IJ SOLN
INTRAMUSCULAR | Status: AC
  Filled 2024-05-30: qty 2

## 2024-05-30 MED ORDER — ONDANSETRON HCL 4 MG/2ML IJ SOLN
INTRAMUSCULAR | Status: AC
  Filled 2024-05-30: qty 2

## 2024-05-30 MED ORDER — MIDAZOLAM HCL 2 MG/2ML IJ SOLN
INTRAMUSCULAR | Status: AC
  Filled 2024-05-30: qty 2

## 2024-05-30 MED ORDER — ROCURONIUM BROMIDE 10 MG/ML (PF) SYRINGE
PREFILLED_SYRINGE | INTRAVENOUS | Status: AC
  Filled 2024-05-30: qty 10

## 2024-05-30 MED ORDER — EPHEDRINE SULFATE (PRESSORS) 25 MG/5ML IV SOSY
PREFILLED_SYRINGE | INTRAVENOUS | Status: DC | PRN
  Administered 2024-05-30: 10 mg via INTRAVENOUS

## 2024-05-30 MED ORDER — ESMOLOL HCL 100 MG/10ML IV SOLN
INTRAVENOUS | Status: AC
  Filled 2024-05-30: qty 10

## 2024-05-30 MED ORDER — LIDOCAINE HCL (PF) 2 % IJ SOLN
INTRAMUSCULAR | Status: AC
  Filled 2024-05-30: qty 5

## 2024-05-30 MED ORDER — ACETAMINOPHEN 10 MG/ML IV SOLN: 1000.0000 mg | Freq: Once | INTRAVENOUS | Status: DC | PRN

## 2024-05-30 MED ORDER — HYDROMORPHONE HCL 1 MG/ML IJ SOLN
INTRAMUSCULAR | Status: AC
  Filled 2024-05-30: qty 1

## 2024-05-30 MED ORDER — DEXAMETHASONE SOD PHOSPHATE PF 10 MG/ML IJ SOLN
INTRAMUSCULAR | Status: AC
  Filled 2024-05-30: qty 1

## 2024-05-30 MED ORDER — PHENYLEPHRINE 80 MCG/ML (10ML) SYRINGE FOR IV PUSH (FOR BLOOD PRESSURE SUPPORT)
PREFILLED_SYRINGE | INTRAVENOUS | Status: DC | PRN
  Administered 2024-05-30: 160 ug via INTRAVENOUS
  Administered 2024-05-30: 80 ug via INTRAVENOUS
  Administered 2024-05-30 (×3): 160 ug via INTRAVENOUS
  Administered 2024-05-30: 80 ug via INTRAVENOUS

## 2024-05-30 MED ORDER — SUGAMMADEX SODIUM 200 MG/2ML IV SOLN
INTRAVENOUS | Status: AC
  Filled 2024-05-30: qty 2

## 2024-05-30 MED ORDER — SUGAMMADEX SODIUM 200 MG/2ML IV SOLN
INTRAVENOUS | Status: DC | PRN
  Administered 2024-05-30: 200 mg via INTRAVENOUS

## 2024-05-30 MED ORDER — LABETALOL HCL 5 MG/ML IV SOLN: 5.0000 mg | Freq: Four times a day (QID) | INTRAVENOUS | Status: AC | PRN

## 2024-05-30 MED ORDER — LACTATED RINGERS IR SOLN
Status: DC | PRN
  Administered 2024-05-30: 1000 mL

## 2024-05-30 MED ORDER — FENTANYL CITRATE (PF) 50 MCG/ML IJ SOSY
PREFILLED_SYRINGE | INTRAMUSCULAR | Status: AC
  Filled 2024-05-30: qty 3

## 2024-05-30 MED ORDER — LIDOCAINE HCL (PF) 2 % IJ SOLN
INTRAMUSCULAR | Status: DC | PRN
  Administered 2024-05-30: 1.5 mg/kg/h via INTRADERMAL
  Administered 2024-05-30: 100 mg via INTRADERMAL

## 2024-05-30 MED ORDER — LABETALOL HCL 5 MG/ML IV SOLN
5.0000 mg | Freq: Once | INTRAVENOUS | Status: AC
  Administered 2024-05-30: 5 mg via INTRAVENOUS

## 2024-05-30 MED ORDER — SODIUM CHLORIDE 0.9 % IV SOLN
2.0000 g | Freq: Once | INTRAVENOUS | Status: AC
  Administered 2024-05-30: 2 g via INTRAVENOUS
  Filled 2024-05-30: qty 2

## 2024-05-30 MED ORDER — LABETALOL HCL 5 MG/ML IV SOLN
INTRAVENOUS | Status: DC | PRN
  Administered 2024-05-30: 5 mg via INTRAVENOUS

## 2024-05-30 MED ORDER — BUPIVACAINE-EPINEPHRINE 0.25% -1:200000 IJ SOLN
INTRAMUSCULAR | Status: DC | PRN
  Administered 2024-05-30: 30 mL

## 2024-05-30 MED ORDER — HYDROMORPHONE HCL 1 MG/ML IJ SOLN
1.0000 mg | Freq: Once | INTRAMUSCULAR | Status: AC | PRN
  Administered 2024-05-30: 1 mg via INTRAVENOUS

## 2024-05-30 MED ORDER — LIDOCAINE HCL 2 % IJ SOLN
INTRAMUSCULAR | Status: AC
  Filled 2024-05-30: qty 20

## 2024-05-30 MED ORDER — OXYCODONE HCL 5 MG/5ML PO SOLN: 5.0000 mg | Freq: Once | ORAL | Status: DC | PRN

## 2024-05-30 MED ORDER — LABETALOL HCL 5 MG/ML IV SOLN
INTRAVENOUS | Status: AC
  Filled 2024-05-30: qty 4

## 2024-05-30 MED ORDER — SODIUM CHLORIDE 0.9 % IV SOLN
INTRAVENOUS | Status: AC
  Filled 2024-05-30: qty 2

## 2024-05-30 MED ORDER — DROPERIDOL 2.5 MG/ML IJ SOLN: 0.6250 mg | Freq: Once | INTRAMUSCULAR | Status: DC | PRN

## 2024-05-30 MED ORDER — SODIUM CHLORIDE 0.9 % IR SOLN
Status: DC | PRN
  Administered 2024-05-30 (×3): 1000 mL

## 2024-05-30 MED ORDER — PROPOFOL 10 MG/ML IV BOLUS
INTRAVENOUS | Status: AC
  Filled 2024-05-30: qty 20

## 2024-05-30 NOTE — Plan of Care (Signed)
" °  Problem: Clinical Measurements: Goal: Will remain free from infection Outcome: Progressing Goal: Diagnostic test results will improve Outcome: Progressing   Problem: Activity: Goal: Risk for activity intolerance will decrease Outcome: Progressing   Problem: Coping: Goal: Level of anxiety will decrease Outcome: Progressing   Problem: Elimination: Goal: Will not experience complications related to bowel motility Outcome: Progressing Goal: Will not experience complications related to urinary retention Outcome: Progressing   Problem: Pain Managment: Goal: General experience of comfort will improve and/or be controlled Outcome: Progressing   Problem: Safety: Goal: Ability to remain free from injury will improve Outcome: Progressing   Problem: Skin Integrity: Goal: Risk for impaired skin integrity will decrease Outcome: Progressing   "

## 2024-05-30 NOTE — Op Note (Signed)
 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   Postoperative Diagnosis: SPLENIC COLON CANCER OBSTRUCTING MASS   Surgical Procedure: Laparoscopic partial colectomy Mobilization of the splenic flexure  Operative Team Members:  Surgeons and Role:    * Delorse Shane, Deward PARAS, MD - Primary   Anesthesiologist: Tilford Franky BIRCH, MD CRNA: Nada Corean CROME, CRNA; Rudolpho Fonda SAUNDERS, CRNA   Anesthesia: General   Fluids:  Total I/O In: 1200 [I.V.:1000; IV Piggyback:200] Out: 230 [Urine:230]  Complications: None  Drains:  none   Specimen:  ID Type Source Tests Collected by Time Destination  1 : SPLENIC FLEXURE OF COLON, STITCH MARKS DISTAL MARGIN & OMENTUM Tissue PATH GI Other SURGICAL PATHOLOGY Sherell Christoffel, Deward PARAS, MD 05/30/2024 1058      Disposition:  PACU - hemodynamically stable.  Plan of Care: Admit to inpatient     Indications for Procedure: Emily Higgins is a 59 y.o. female who presented with a partially obstructing colon adenocarcinoma at the splenic flexure.  I recommended laparoscopic partial colectomy.  We discussed the procedure, its risks, benefits and alternatives and the patient granted consent to proceed.  The procedure itself as well as its risks, benefits and alternatives were discussed.  The risks discussed included but were not limited to the risk of infection, bleeding, damage to nearby structures, and anastomotic leak.  After a full discussion and all questions answered the patient granted consent to proceed.  Findings: mass at splenic flexure of colon, tattoo, with nearby enlarged lymph nodes.   Description of Procedure:   On the date stated above the patient is taken the operating suite and placed in supine position after general endotracheal anesthesia was induced.  A timeout was completed verifying the correct patient, procedure, positioning, and equipment  needed for the case.  A Foley catheter was placed.  The patient's abdomen was prepped and draped in usual sterile fashion.  I made a mini laparotomy incision about the with my hand at the umbilicus and entered the abdomen without any trauma the underlying viscera.  A Alexis wound protector was placed with the HandPort.  A 12 mm trocar was placed through the HandPort and the abdomen was inflated to 15 mmHg.  2 additional 5 mm trocars were placed to the right and left of this initial HandPort.  The patient was placed in reverse Trendelenburg right side down positioning and I inspected the left upper quadrant.  The tattooed mass was located at the splenic flexure.  I mobilized the descending colon out of the retroperitoneum by dividing the white line of Toldt and all retroperitoneal attachments between the descending colon and the retroperitoneum.  I mobilized the distal transverse colon by lifting the omentum off of the colon and dividing the gastrocolic ligament.  I worked both from the transverse colon towards the spleen and from the descending colon towards the spleen to divide the splenocolic ligament carefully without injury to the spleen.  This enabled me to medialize the left colon, and divide all attachments between the left colon and the retroperitoneum.  The colon was divided proximally distally to the mass using the white load of the endoscopic linear stapler.  I ensured adequate blood flow proximal and distal to the the specimen with palpation of the mesentery.  The mesocolon of the specimen was divided using the LigaSure harmonic and a clip on the larger vessel..  While dividing the mesocolon I did identify the lymphadenopathy  that was present on preoperative CT scan and incorporated this into the specimen.  Mesocolon was divided and the specimen was then delivered through the mini laparotomy incision and passed off the field.  Some additional omentum was included in the specimen as it had been  devascularized and therefore resected while mobilizing the transverse colon.  With the specimen removed we directed our attention to reanastomosis.  The transverse colon was freely mobile.  The descending colon was still fixed somewhat to the retroperitoneum distally.  I continued my dissection down the white line of Toldt to medialize the colon all the way down through the sigmoid colon.  This provided additional length and enabled me to deliver the divided ends of the descending and transverse colon through the mini laparotomy incision to create the anastomosis.  Enterotomies were made along the tinea and the 60 mm endoscopic linear stapler was inserted into both and fired to create the anastomosis.  A Vicryl stitch was placed at the apex of the staple line to reinforce the anastomosis.  The common enterotomy was closed using 2-0 Vicryl in 2 layers.  The staple lines that divided the colon were oversewn using 2-0 Vicryl.  The anastomosis felt widely patent was returned to the abdomen.  The abdomen was irrigated and then we switched to a clean closure set of instruments, changed gown and gloves, and proceeded with clean closure.  Midline mini laparotomy incision was closed using running 0 PDS suture.  The subcutaneous layer was irrigated and then closed at the deep dermal layer with 3-0 Vicryl and the skin with 4-0 Monocryl.  The two 5 mm trocar port sites were closed with 4-0 Monocryl.  Dermabond was applied.  All sponge needle counts were correct at the end this case.   At the end of the case we reviewed the infection status of the case. Patient: Darryle Law Emergency General Surgery Service Patient Case: Urgent Infection Present At Time Of Surgery (PATOS): Contamination from creating colonic anastomosis  Deward Foy, MD General, Bariatric, & Minimally Invasive Surgery [facility]  "

## 2024-05-30 NOTE — Transfer of Care (Signed)
 Immediate Anesthesia Transfer of Care Note  Patient: Emily Higgins  Procedure(s) Performed: LAPAROSCOPIC PARTIAL COLECTOMY (Abdomen)  Patient Location: PACU  Anesthesia Type:General  Level of Consciousness: awake, alert , oriented, and patient cooperative  Airway & Oxygen Therapy: Patient Spontanous Breathing and Patient connected to face mask oxygen  Post-op Assessment: Report given to RN and Post -op Vital signs reviewed and stable  Post vital signs: Reviewed and stable  Last Vitals:  Vitals Value Taken Time  BP 198/93 05/30/24 12:30  Temp    Pulse 84 05/30/24 12:31  Resp 16 05/30/24 12:31  SpO2 100 % 05/30/24 12:31  Vitals shown include unfiled device data.  Last Pain:  Vitals:   05/30/24 0834  TempSrc: Oral  PainSc: 0-No pain      Patients Stated Pain Goal: 0 (05/28/24 9167)  Complications: No notable events documented.

## 2024-05-30 NOTE — Anesthesia Procedure Notes (Signed)
 Procedure Name: Intubation Date/Time: 05/30/2024 9:45 AM  Performed by: Nada Corean CROME, CRNAPre-anesthesia Checklist: Suction available, Emergency Drugs available, Patient identified, Patient being monitored and Timeout performed Patient Re-evaluated:Patient Re-evaluated prior to induction Oxygen Delivery Method: Circle system utilized Preoxygenation: Pre-oxygenation with 100% oxygen Induction Type: IV induction Ventilation: Mask ventilation without difficulty Laryngoscope Size: Mac and 3 Grade View: Grade I Tube type: Oral Tube size: 7.0 mm Number of attempts: 1 Airway Equipment and Method: Stylet Placement Confirmation: positive ETCO2, ETT inserted through vocal cords under direct vision and CO2 detector Secured at: 21 cm Tube secured with: Tape Dental Injury: Teeth and Oropharynx as per pre-operative assessment

## 2024-05-30 NOTE — Progress Notes (Signed)
 Progress Note: General Surgery Service   Chief Complaint/Subjective: Seen in preop  Objective: Vital signs in last 24 hours: Temp:  [98.1 F (36.7 C)-99.2 F (37.3 C)] 98.1 F (36.7 C) (02/06 0834) Pulse Rate:  [58-95] 95 (02/06 0834) Resp:  [15-18] 16 (02/06 0834) BP: (142-176)/(70-100) 155/100 (02/06 0834) SpO2:  [93 %-98 %] 93 % (02/06 0834) Weight:  [88.5 kg] 88.5 kg (02/06 0834) Last BM Date : 05/29/24  Intake/Output from previous day: 02/05 0701 - 02/06 0700 In: 480 [P.O.:480] Out: -  Intake/Output this shift: No intake/output data recorded.  Constitutional: NAD; conversant; no deformities Eyes: Moist conjunctiva; no lid lag; anicteric; PERRL Neck: Trachea midline; no thyromegaly Lungs: Normal respiratory effort; no tactile fremitus CV: RRR; no palpable thrills; no pitting edema GI: Abd Soft, nontender; no palpable hepatosplenomegaly MSK: Normal range of motion of extremities; no clubbing/cyanosis Psychiatric: Appropriate affect; alert and oriented x3 Lymphatic: No palpable cervical or axillary lymphadenopathy  Lab Results: CBC  Recent Labs    05/28/24 0634 05/29/24 0736  WBC 4.4 5.1  HGB 11.6* 12.4  HCT 37.0 37.6  PLT 223 233   BMET Recent Labs    05/28/24 0634 05/29/24 0736  NA 136 136  K 3.4* 3.9  CL 100 97*  CO2 25 29  GLUCOSE 84 97  BUN 7 <5*  CREATININE 0.70 0.64  CALCIUM 9.2 9.4   PT/INR No results for input(s): LABPROT, INR in the last 72 hours. ABG No results for input(s): PHART, HCO3 in the last 72 hours.  Invalid input(s): PCO2, PO2  Anti-infectives: Anti-infectives (From admission, onward)    None       Medications: Scheduled Meds:  [MAR Hold] hydrochlorothiazide   25 mg Oral Daily   [MAR Hold] ondansetron  (ZOFRAN ) IV  4 mg Intravenous Once   Continuous Infusions: PRN Meds:.[MAR Hold] acetaminophen  **OR** [MAR Hold] acetaminophen , [MAR Hold] albuterol , [MAR Hold] hydrALAZINE , [MAR Hold]  HYDROmorphone   (DILAUDID ) injection, [MAR Hold] ondansetron  **OR** [MAR Hold] ondansetron  (ZOFRAN ) IV, [MAR Hold] oxyCODONE , [MAR Hold] traZODone   Assessment/Plan: Emily Higgins is a 59 year old female with a nearly completely obstructing sigmoid flexure mass.   Colonoscopy pathology: A. MASS, SPLENIC FLEXURE, BIOPSY:  - Invasive adenocarcinoma, moderately differentiated (see comment).  Comment: This specimen was seen in peer review by Dr. Janel, who  concurs with the interpretation.  This result was reported to Dr. Rosalie  on 05/29/2024.  B. SIGMOID, POLYPECTOMY:  - Tubular adenoma.   I recommended laparoscopic, hand assisted, partial colectomy.  We discussed the procedure, its risks, benefits and alteratives.  After a full discussion and all questions answered the patient granted consent to proceed.   LOS: 4 days   Emily JINNY Foy, MD  [facility] Use [email or web address] to contact on call provider  Daily Billing: [id number] - High MDM

## 2024-05-30 NOTE — Plan of Care (Signed)
" °  Problem: Clinical Measurements: Goal: Ability to maintain clinical measurements within normal limits will improve Outcome: Progressing Goal: Will remain free from infection 05/30/2024 2100 by Watt Krabbe, RN Outcome: Progressing 05/30/2024 0731 by Watt Krabbe, RN Outcome: Progressing Goal: Diagnostic test results will improve 05/30/2024 2100 by Watt Krabbe, RN Outcome: Progressing 05/30/2024 0731 by Watt Krabbe, RN Outcome: Progressing   Problem: Activity: Goal: Risk for activity intolerance will decrease 05/30/2024 2100 by Watt Krabbe, RN Outcome: Progressing 05/30/2024 0731 by Watt Krabbe, RN Outcome: Progressing   Problem: Nutrition: Goal: Adequate nutrition will be maintained Outcome: Progressing   Problem: Coping: Goal: Level of anxiety will decrease Outcome: Progressing   Problem: Elimination: Goal: Will not experience complications related to bowel motility 05/30/2024 2100 by Watt Krabbe, RN Outcome: Progressing 05/30/2024 0731 by Watt Krabbe, RN Outcome: Progressing Goal: Will not experience complications related to urinary retention 05/30/2024 2100 by Watt Krabbe, RN Outcome: Progressing 05/30/2024 0731 by Watt Krabbe, RN Outcome: Progressing   Problem: Pain Managment: Goal: General experience of comfort will improve and/or be controlled 05/30/2024 2100 by Watt Krabbe, RN Outcome: Progressing 05/30/2024 0731 by Watt Krabbe, RN Outcome: Progressing   Problem: Safety: Goal: Ability to remain free from injury will improve 05/30/2024 2100 by Watt Krabbe, RN Outcome: Progressing 05/30/2024 0731 by Watt Krabbe, RN Outcome: Progressing   Problem: Skin Integrity: Goal: Risk for impaired skin integrity will decrease 05/30/2024 2100 by Watt Krabbe, RN Outcome: Progressing 05/30/2024 0731 by Watt Krabbe, RN Outcome: Progressing   "

## 2024-05-30 NOTE — Progress Notes (Signed)
" °  Progress Note   Patient: Emily Higgins FMW:[id number] DOB: 1966-03-28 DOA: 05/26/2024     4 DOS: the patient was seen and examined on 05/30/2024        Brief hospital course: 59 y.o. F with HTN, obesity, presented with several weeks of abdominal pain, nausea.  In the ER Cr showed a masslike thickening of the colon at the splenic flexure.  GI and General Surgery consulted.    Colonoscopy on HD 2 showed a malignant appearing mass, biopsied.     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 Fern - Postop care per general surgery  Hypertension Blood pressure elevated due to pain - Continue HCTZ - Continue as needed hydralazine  for severe range blood pressure - Add as needed labetalol   Class II obesity      Subjective: Patient is having some soreness and burning postop, but Eppy seems appropriate.  Had surgery today, doing well, mentation      Physical Exam: BP (!) 179/98   Pulse 87   Temp 98.3 F (36.8 C)   Resp 20   Ht 5' (1.524 m)   Wt 88.5 kg   LMP 04/29/2017   SpO2 96%   BMI 38.08 kg/m   Adult female, lying in bed, no acute distress Mentation good, oriented to person, place, and time, face symmetric, chronic atrophy of the right hand    Data Reviewed: Basic metabolic panel and CBC unremarkable    Family Communication: Daughter at the bedside    Disposition: Status is: Inpatient         Author: Lonni SHAUNNA Dalton, MD 05/30/2024 6:27 PM  For on call review [email or web address]    "

## 2024-05-30 NOTE — Anesthesia Preprocedure Evaluation (Signed)
"                                    Anesthesia Evaluation  Patient identified by MRN, date of birth, ID band Patient awake    Reviewed: Allergy & Precautions, NPO status , Patient's Chart, lab work & pertinent test results  Airway Mallampati: II  TM Distance: >3 FB Neck ROM: Full    Dental  (+) Poor Dentition, Dental Advisory Given   Pulmonary neg pulmonary ROS   breath sounds clear to auscultation       Cardiovascular negative cardio ROS  Rhythm:Regular Rate:Normal     Neuro/Psych  Neuromuscular disease  negative psych ROS   GI/Hepatic negative GI ROS, Neg liver ROS,,,  Endo/Other  negative endocrine ROS    Renal/GU negative Renal ROS     Musculoskeletal  (+) Arthritis ,    Abdominal   Peds  Hematology negative hematology ROS (+)   Anesthesia Other Findings   Reproductive/Obstetrics                              Anesthesia Physical Anesthesia Plan  ASA: 2  Anesthesia Plan: General   Post-op Pain Management: Tylenol  PO (pre-op)* and Toradol  IV (intra-op)*   Induction: Intravenous  PONV Risk Score and Plan: 4 or greater and Dexamethasone , Ondansetron , Midazolam  and Scopolamine patch - Pre-op  Airway Management Planned: Oral ETT  Additional Equipment: None  Intra-op Plan:   Post-operative Plan: Extubation in OR  Informed Consent: I have reviewed the patients History and Physical, chart, labs and discussed the procedure including the risks, benefits and alternatives for the proposed anesthesia with the patient or authorized representative who has indicated his/her understanding and acceptance.     Dental advisory given  Plan Discussed with: CRNA  Anesthesia Plan Comments:          Anesthesia Quick Evaluation  "
